# Patient Record
Sex: Female | Born: 1990 | Race: Black or African American | Hispanic: No | Marital: Single | Smoking: Current some day smoker
Health system: Southern US, Community
[De-identification: ages and names within clinical notes are randomized; demographics above are authoritative.]

## PROBLEM LIST (undated history)

## (undated) ENCOUNTER — Inpatient Hospital Stay (HOSPITAL_COMMUNITY): Payer: Self-pay

## (undated) ENCOUNTER — Emergency Department (HOSPITAL_COMMUNITY): Admission: EM

## (undated) DIAGNOSIS — E282 Polycystic ovarian syndrome: Secondary | ICD-10-CM

## (undated) DIAGNOSIS — D219 Benign neoplasm of connective and other soft tissue, unspecified: Secondary | ICD-10-CM

## (undated) HISTORY — PX: WISDOM TOOTH EXTRACTION: SHX21

---
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
## 2009-03-27 ENCOUNTER — Inpatient Hospital Stay (HOSPITAL_COMMUNITY): Admission: AD | Admit: 2009-03-27 | Discharge: 2009-03-28 | Payer: Self-pay | Admitting: Obstetrics & Gynecology

## 2010-02-12 ENCOUNTER — Inpatient Hospital Stay (HOSPITAL_COMMUNITY): Admission: AD | Admit: 2010-02-12 | Discharge: 2010-02-12 | Payer: Self-pay | Admitting: Obstetrics & Gynecology

## 2010-05-25 ENCOUNTER — Ambulatory Visit: Payer: Self-pay | Admitting: Nurse Practitioner

## 2010-05-25 ENCOUNTER — Inpatient Hospital Stay (HOSPITAL_COMMUNITY): Admission: AD | Admit: 2010-05-25 | Discharge: 2010-05-25 | Payer: Self-pay | Admitting: Obstetrics & Gynecology

## 2010-05-30 ENCOUNTER — Ambulatory Visit: Payer: Self-pay | Admitting: Nurse Practitioner

## 2010-05-30 ENCOUNTER — Inpatient Hospital Stay (HOSPITAL_COMMUNITY): Admission: AD | Admit: 2010-05-30 | Discharge: 2010-05-30 | Payer: Self-pay | Admitting: Obstetrics & Gynecology

## 2010-07-01 ENCOUNTER — Inpatient Hospital Stay (HOSPITAL_COMMUNITY): Admission: AD | Admit: 2010-07-01 | Discharge: 2010-07-01 | Payer: Self-pay | Admitting: Family Medicine

## 2010-07-18 ENCOUNTER — Ambulatory Visit (HOSPITAL_COMMUNITY): Admission: RE | Admit: 2010-07-18 | Discharge: 2010-07-18 | Payer: Self-pay | Admitting: Family Medicine

## 2010-07-25 ENCOUNTER — Ambulatory Visit: Payer: Self-pay | Admitting: Obstetrics and Gynecology

## 2010-07-30 ENCOUNTER — Ambulatory Visit (HOSPITAL_COMMUNITY): Admission: RE | Admit: 2010-07-30 | Discharge: 2010-07-30 | Payer: Self-pay | Admitting: Obstetrics & Gynecology

## 2010-08-07 ENCOUNTER — Ambulatory Visit: Payer: Self-pay | Admitting: Physician Assistant

## 2010-08-07 ENCOUNTER — Inpatient Hospital Stay (HOSPITAL_COMMUNITY): Admission: AD | Admit: 2010-08-07 | Discharge: 2010-08-07 | Payer: Self-pay | Admitting: Obstetrics & Gynecology

## 2010-08-22 ENCOUNTER — Ambulatory Visit: Payer: Self-pay | Admitting: Advanced Practice Midwife

## 2010-08-22 ENCOUNTER — Inpatient Hospital Stay (HOSPITAL_COMMUNITY): Admission: AD | Admit: 2010-08-22 | Discharge: 2010-08-23 | Payer: Self-pay | Admitting: Obstetrics & Gynecology

## 2010-09-12 ENCOUNTER — Ambulatory Visit: Payer: Self-pay | Admitting: Obstetrics & Gynecology

## 2010-09-12 ENCOUNTER — Encounter (INDEPENDENT_AMBULATORY_CARE_PROVIDER_SITE_OTHER): Payer: Self-pay | Admitting: *Deleted

## 2010-09-12 LAB — CONVERTED CEMR LAB
MCV: 90.9 fL (ref 78.0–100.0)
Platelets: 226 10*3/uL (ref 150–400)
RDW: 13 % (ref 11.5–15.5)

## 2010-10-10 ENCOUNTER — Ambulatory Visit: Payer: Self-pay | Admitting: Obstetrics & Gynecology

## 2010-10-17 ENCOUNTER — Ambulatory Visit (HOSPITAL_COMMUNITY): Admission: RE | Admit: 2010-10-17 | Discharge: 2010-10-17 | Payer: Self-pay | Admitting: Family Medicine

## 2010-11-07 ENCOUNTER — Ambulatory Visit: Payer: Self-pay | Admitting: Obstetrics & Gynecology

## 2010-11-09 ENCOUNTER — Emergency Department (HOSPITAL_COMMUNITY)
Admission: EM | Admit: 2010-11-09 | Discharge: 2010-11-09 | Payer: Self-pay | Source: Home / Self Care | Admitting: Emergency Medicine

## 2010-11-14 ENCOUNTER — Ambulatory Visit: Payer: Self-pay | Admitting: Obstetrics & Gynecology

## 2010-11-21 ENCOUNTER — Ambulatory Visit: Payer: Self-pay | Admitting: Family Medicine

## 2010-11-22 ENCOUNTER — Observation Stay (HOSPITAL_COMMUNITY)
Admission: RE | Admit: 2010-11-22 | Discharge: 2010-11-22 | Payer: Self-pay | Source: Home / Self Care | Attending: Obstetrics and Gynecology | Admitting: Obstetrics and Gynecology

## 2010-11-28 ENCOUNTER — Encounter (INDEPENDENT_AMBULATORY_CARE_PROVIDER_SITE_OTHER): Payer: Self-pay | Admitting: *Deleted

## 2010-11-28 ENCOUNTER — Ambulatory Visit: Payer: Self-pay | Admitting: Obstetrics & Gynecology

## 2010-11-28 LAB — CONVERTED CEMR LAB
Chlamydia, Swab/Urine, PCR: NEGATIVE
GC Probe Amp, Urine: NEGATIVE

## 2010-12-02 ENCOUNTER — Inpatient Hospital Stay (HOSPITAL_COMMUNITY)
Admission: RE | Admit: 2010-12-02 | Discharge: 2010-12-04 | Payer: Self-pay | Source: Home / Self Care | Attending: Obstetrics & Gynecology | Admitting: Obstetrics & Gynecology

## 2010-12-15 ENCOUNTER — Inpatient Hospital Stay (HOSPITAL_COMMUNITY)
Admission: AD | Admit: 2010-12-15 | Discharge: 2010-12-15 | Payer: Self-pay | Source: Home / Self Care | Attending: Family Medicine | Admitting: Family Medicine

## 2010-12-21 ENCOUNTER — Encounter: Payer: Self-pay | Admitting: Obstetrics & Gynecology

## 2010-12-22 ENCOUNTER — Encounter: Payer: Self-pay | Admitting: *Deleted

## 2010-12-22 ENCOUNTER — Encounter: Payer: Self-pay | Admitting: Gastroenterology

## 2010-12-22 ENCOUNTER — Encounter: Payer: Self-pay | Admitting: Obstetrics & Gynecology

## 2010-12-26 NOTE — Discharge Summary (Addendum)
  NAMEYAMILEE, Michele Boyd              ACCOUNT NO.:  [id number]  MEDICAL RECORD NO.:  [id number]          PATIENT TYPE:  INP  LOCATION:  9111                          [facility]:  [facility]  PHYSICIAN:  Allie Bossier, MD        DATE OF BIRTH:  [birth year]  DATE OF ADMISSION:  12/02/2010 DATE OF DISCHARGE:  12/04/2010                              DISCHARGE SUMMARY   ADMISSION DIAGNOSES: 1. Intrauterine pregnancy at 13 and 1 weeks. 2. Persistent breech presentation status failed external cephalic     version attempt.  DISCHARGE DIAGNOSIS:   1. Status post primary low transverse cesarean section via Pfannenstiel skin incision on December 02, 2010 for persistent breech presentation. 2. Uterine Fibroid  ATTENDING: Allie Bossier, MD   FELLOW: Maryelizabeth Kaufmann, MD.  PERTINENT FINDINGS:  Delivery of a viable female in breech presentation, Apgars were 8 and 9, weight was 6 pounds and 4 ounces, clear amniotic fluid.  Intact placenta with 3-vessel cord, approximately 10-cm fibroid on top of the uterus, subserosal and myometrial.  Otherwise, normal ovaries bilaterally and tubes.  No complications immediately.  HOSPITAL COURSE:  This is a 20 year old gravid 1 who presented at 46 and 1 weeks with persistent breech presentation and underwent a primary cesarean section for persistent breech presentation status post failed ECV attempt.  The patient's postoperative course was subsequently benign.  The patient was otherwise hemodynamically stable.  Her postoperative hemoglobin was 7.7 and 32.5.  She was otherwise ambulatory and doing well without any acute complaints at the time of discharge. Patient is  to take iron for postpartum anemia. She was discharged in stable condition on postoperative day #2.  DISCHARGE MEDICATIONS: 1. Ibuprofen 600 mg 1 tab p.o. q.6 hours p.r.n. 2. Percocet 5/325 1 tab p.o. q.4 hours p.r.n. 3. Colace 100 mg 1 tab p.o. b.i.d. 4. Depo-Provera prior to discharge for postpartum  contraception. 5. Ferrous sulfate over the counter twice daily for anemia  FOLLOWUP:  The patient is to follow up in the Health Department in 6 weeks for postpartum check.  ER WARNINGS:  The patient is to return to the emergency department with any fevers, chills, nausea, vomiting, any incisional problems, redness, swelling, discharge, any type of opening or dehiscence.    ______________________________ Maryelizabeth Kaufmann, MD   ______________________________ Allie Bossier, MD    LC/MEDQ  D:  12/04/2010  T:  12/05/2010  Job:  [id number]  Electronically Signed by Maryelizabeth Kaufmann MD on 12/09/2010 11:20:40 AM Electronically Signed by Nicholaus Bloom MD on 12/26/2010 03:48:13 PM

## 2010-12-30 ENCOUNTER — Ambulatory Visit: Admit: 2010-12-30 | Payer: Self-pay | Admitting: Obstetrics & Gynecology

## 2011-02-05 ENCOUNTER — Other Ambulatory Visit: Payer: Self-pay | Admitting: Family Medicine

## 2011-02-05 ENCOUNTER — Ambulatory Visit (INDEPENDENT_AMBULATORY_CARE_PROVIDER_SITE_OTHER): Payer: Medicaid Other | Admitting: Family Medicine

## 2011-02-05 ENCOUNTER — Encounter (INDEPENDENT_AMBULATORY_CARE_PROVIDER_SITE_OTHER): Payer: Self-pay | Admitting: *Deleted

## 2011-02-05 DIAGNOSIS — D259 Leiomyoma of uterus, unspecified: Secondary | ICD-10-CM

## 2011-02-05 LAB — CONVERTED CEMR LAB
HCT: 42 % (ref 36.0–46.0)
MCHC: 33.6 g/dL (ref 30.0–36.0)
MCV: 88.6 fL (ref 78.0–100.0)
Platelets: 339 10*3/uL (ref 150–400)
RBC: 4.74 M/uL (ref 3.87–5.11)
TSH: 0.531 microintl units/mL (ref 0.350–4.500)
WBC: 5.4 10*3/uL (ref 4.0–10.5)

## 2011-02-10 LAB — POCT URINALYSIS DIPSTICK
Bilirubin Urine: NEGATIVE
Bilirubin Urine: NEGATIVE
Glucose, UA: NEGATIVE mg/dL
Glucose, UA: NEGATIVE mg/dL
Glucose, UA: NEGATIVE mg/dL
Hgb urine dipstick: NEGATIVE
Ketones, ur: NEGATIVE mg/dL
Ketones, ur: NEGATIVE mg/dL
Nitrite: NEGATIVE
Nitrite: NEGATIVE
Protein, ur: NEGATIVE mg/dL
Urobilinogen, UA: 0.2 mg/dL (ref 0.0–1.0)
Urobilinogen, UA: 0.2 mg/dL (ref 0.0–1.0)
Urobilinogen, UA: 0.2 mg/dL (ref 0.0–1.0)
pH: 7 (ref 5.0–8.0)
pH: 8 (ref 5.0–8.0)

## 2011-02-10 LAB — CBC
MCHC: 34.3 g/dL (ref 30.0–36.0)
MCV: 87.9 fL (ref 78.0–100.0)
RBC: 3.65 MIL/uL — ABNORMAL LOW (ref 3.87–5.11)
RDW: 13.6 % (ref 11.5–15.5)
RDW: 13.6 % (ref 11.5–15.5)
WBC: 8.6 10*3/uL (ref 4.0–10.5)

## 2011-02-10 LAB — TYPE AND SCREEN
ABO/RH(D): O POS
Antibody Screen: NEGATIVE

## 2011-02-11 LAB — POCT URINALYSIS DIPSTICK
Bilirubin Urine: NEGATIVE
Bilirubin Urine: NEGATIVE
Glucose, UA: NEGATIVE mg/dL
Hgb urine dipstick: NEGATIVE
Hgb urine dipstick: NEGATIVE
Ketones, ur: NEGATIVE mg/dL
Nitrite: NEGATIVE
Protein, ur: NEGATIVE mg/dL
Specific Gravity, Urine: 1.02 (ref 1.005–1.030)

## 2011-02-13 LAB — URINALYSIS, ROUTINE W REFLEX MICROSCOPIC
Bilirubin Urine: NEGATIVE
Bilirubin Urine: NEGATIVE
Glucose, UA: NEGATIVE mg/dL
Hgb urine dipstick: NEGATIVE
Hgb urine dipstick: NEGATIVE
Ketones, ur: NEGATIVE mg/dL
Nitrite: NEGATIVE
Nitrite: NEGATIVE
Protein, ur: NEGATIVE mg/dL
Specific Gravity, Urine: 1.02 (ref 1.005–1.030)
pH: 6 (ref 5.0–8.0)

## 2011-02-13 LAB — POCT URINALYSIS DIPSTICK
Ketones, ur: NEGATIVE mg/dL
Specific Gravity, Urine: 1.02 (ref 1.005–1.030)

## 2011-02-13 LAB — URINE CULTURE: Colony Count: >=100000

## 2011-02-14 LAB — WET PREP, GENITAL
Clue Cells Wet Prep HPF POC: NONE SEEN
Trich, Wet Prep: NONE SEEN

## 2011-02-14 LAB — URINALYSIS, ROUTINE W REFLEX MICROSCOPIC
Bilirubin Urine: NEGATIVE
Glucose, UA: NEGATIVE mg/dL
Hgb urine dipstick: NEGATIVE
Protein, ur: NEGATIVE mg/dL
Specific Gravity, Urine: >1.03 — ABNORMAL HIGH (ref 1.005–1.030)

## 2011-02-14 LAB — POCT URINALYSIS DIPSTICK
Bilirubin Urine: NEGATIVE
Hgb urine dipstick: NEGATIVE
Nitrite: NEGATIVE
Protein, ur: NEGATIVE mg/dL
Specific Gravity, Urine: 1.02 (ref 1.005–1.030)
pH: 7 (ref 5.0–8.0)

## 2011-02-14 LAB — URINE CULTURE

## 2011-02-14 LAB — GC/CHLAMYDIA PROBE AMP, GENITAL
Chlamydia, DNA Probe: NEGATIVE
GC Probe Amp, Genital: NEGATIVE

## 2011-02-16 LAB — URINALYSIS, ROUTINE W REFLEX MICROSCOPIC
Hgb urine dipstick: NEGATIVE
Urobilinogen, UA: 0.2 mg/dL (ref 0.0–1.0)

## 2011-02-16 LAB — URINE CULTURE: Colony Count: >=100000

## 2011-02-16 LAB — GC/CHLAMYDIA PROBE AMP, GENITAL: GC Probe Amp, Genital: NEGATIVE

## 2011-02-17 ENCOUNTER — Ambulatory Visit (HOSPITAL_COMMUNITY): Payer: Medicaid Other | Attending: Family Medicine

## 2011-02-21 NOTE — Progress Notes (Signed)
NAME:  Michele Boyd, Michele Boyd              ACCOUNT NO.:  [id number]  MEDICAL RECORD NO.:  [id number]           PATIENT TYPE:  A  LOCATION:  [facility]                   FACILITY:  [facility]  PHYSICIAN:  Lucina Mellow, DO   DATE OF BIRTH:  11-15-1991  DATE OF SERVICE:  02/05/2011                                 CLINIC NOTE  The patient is a 20 year old gravida 1, now para 1 who presents to the [facility] for her postpartum exam.  HISTORY OF PRESENT ILLNESS:  The patient had a C-section on December 02, 2010, after persistent breech presentation after failed version.  The patient was admitted on December 02, 2010, and was discharged home on December 04, 2010.  Separate operative and discharge summaries are dictated elsewhere and it included in part of the patient's clinic chart.  The patient had a routine C-section, delivered a viable infant female, Apgars 8 and 9, weight of 6 pounds 4 ounces. There was noted prior to delivery of a 10-cm fibroid this again was seen and noted subserosal and myometrial during the time of the C-section and it was left in place.  The patient states that she has had some form of bleeding every day since she has had her C-section and most of time it is bright red bleeding.  On occasion, she will have clots, occasionally it will get darker but it does not stay dark for more than a few hours. She reports that she has had heavy bleeding throughout the bigger portion today and declines having a pelvic exam to evaluate.  She has returned sexual activity.  She states it was not painful and was not uncomfortable.  She is not breast-feeding and has not breastfed at all. She received a Depo-Provera shot prior to discharge from hospital and wishes to continue this for her use of birth control.  She has no other complaints besides the continued bleeding concerns of the uterine fibroids.  PAST MEDICAL HISTORY:  Benign.  PAST SURGICAL HISTORY:  C-section at 3 and 1  weeks'.  MEDICATIONS:  None.  ALLERGIES:  No known allergies.  PHYSICAL EXAMINATION:  VITAL SIGNS:  On exam today, the patient is 196.8 pounds, height is 62 inches, temperature 99, pulse 93, blood pressure 113/79. GENERAL:  She is a pleasant African American female who looks her stated age at 20 years old. HEART:  Regular rate and rhythm with no audible murmurs. LUNGS:  Clear to auscultation bilaterally.  Thyroid not palpable. ABDOMEN:  Positive bowel sounds, soft, and benign.  Lower transverse incision site noted to be well healed and nontender on palpation as noted GYN and breast exams are deferred per patient request today.  ASSESSMENT: 1. Postpartum exam.  The patient is doing well postpartum but     continues to have vaginal bleeding.  We will obtain a CBC to     evaluate for anemia and a TSH because her continue bleeding. 2. Uterine fibroid.  We will obtain pelvic ultrasound to determine the     size of the uterine fibroid and to determine if surgical     intervention is necessary at this time.  It may also  be a source     for bleeding.  She may warrant further evaluation by one of our GYN     surgeons to discuss treatment options. 3. Birth control.  The patient understands that she has until April 2     to get her second Depo shot and she can get that either here at our     clinic or she can go to the [facility] downtown [facility].  The patient is to return to our clinic     after her ultrasound and blood work done, so that we can discuss     results and further treatment plans.  She voices understanding and     agrees with this plan.  Finally, her [place] postpartum     depression scale is 2-3 which is negative screen, and she does not     need to be screened further at this time.          ______________________________ Lucina Mellow, DO    SH/MEDQ  D:  02/05/2011  T:  02/06/2011  Job:  [id number]

## 2011-02-23 LAB — URINALYSIS, ROUTINE W REFLEX MICROSCOPIC
Glucose, UA: NEGATIVE mg/dL
Specific Gravity, Urine: 1.025 (ref 1.005–1.030)
pH: 5.5 (ref 5.0–8.0)

## 2011-02-23 LAB — URINE MICROSCOPIC-ADD ON

## 2011-03-12 LAB — WET PREP, GENITAL: Yeast Wet Prep HPF POC: NONE SEEN

## 2011-03-12 LAB — URINALYSIS, ROUTINE W REFLEX MICROSCOPIC
Glucose, UA: NEGATIVE mg/dL
Protein, ur: NEGATIVE mg/dL
pH: 6 (ref 5.0–8.0)

## 2011-03-14 ENCOUNTER — Ambulatory Visit: Payer: Medicaid Other | Admitting: Obstetrics & Gynecology

## 2011-08-28 LAB — URINALYSIS, ROUTINE W REFLEX MICROSCOPIC
Bilirubin Urine: NEGATIVE
Nitrite: NEGATIVE
Specific Gravity, Urine: 1.025
pH: 6.5

## 2011-08-28 LAB — POCT PREGNANCY, URINE: Operator id: 234331

## 2011-12-02 NOTE — L&D Delivery Note (Signed)
Delivery Note At 12:27 AM a viable and healthy female was delivered via  (Presentation: OA ).  APGAR: 9/9, ; weight .   Placenta status: Spontaneously and grossly intact with succenturate lobe noted. Cord:  with the following complications:   Very short cord.  Cord pH: 7.40 Amniotic fluid very foul smelling  No difficulty with shoulders  Anesthesia: Epidural  Episiotomy:  Lacerations:  Suture Repair: none Est. Blood Loss (mL):   Mom to postpartum.  Baby to nursery-stable .  [facility] 04/27/2012, 12:44 AM

## 2012-02-12 ENCOUNTER — Encounter (HOSPITAL_COMMUNITY): Payer: Self-pay | Admitting: *Deleted

## 2012-02-12 ENCOUNTER — Inpatient Hospital Stay (HOSPITAL_COMMUNITY)
Admission: AD | Admit: 2012-02-12 | Discharge: 2012-02-13 | Disposition: A | Discharge status: Home / Self Care | Payer: Medicaid Other | Source: Ambulatory Visit | Attending: Obstetrics & Gynecology | Admitting: Obstetrics & Gynecology

## 2012-02-12 ENCOUNTER — Inpatient Hospital Stay (HOSPITAL_COMMUNITY): Payer: Medicaid Other

## 2012-02-12 DIAGNOSIS — O99891 Other specified diseases and conditions complicating pregnancy: Secondary | ICD-10-CM | POA: Insufficient documentation

## 2012-02-12 DIAGNOSIS — D259 Leiomyoma of uterus, unspecified: Secondary | ICD-10-CM | POA: Insufficient documentation

## 2012-02-12 DIAGNOSIS — R109 Unspecified abdominal pain: Secondary | ICD-10-CM | POA: Insufficient documentation

## 2012-02-12 DIAGNOSIS — O3410 Maternal care for benign tumor of corpus uteri, unspecified trimester: Secondary | ICD-10-CM | POA: Insufficient documentation

## 2012-02-12 HISTORY — DX: Benign neoplasm of connective and other soft tissue, unspecified: D21.9

## 2012-02-12 LAB — CBC
MCH: 31.3 pg (ref 26.0–34.0)
MCHC: 33.1 g/dL (ref 30.0–36.0)
Platelets: 221 10*3/uL (ref 150–400)
RDW: 13.3 % (ref 11.5–15.5)

## 2012-02-12 LAB — POCT PREGNANCY, URINE: Preg Test, Ur: POSITIVE — AB

## 2012-02-12 LAB — URINALYSIS, ROUTINE W REFLEX MICROSCOPIC
Hgb urine dipstick: NEGATIVE
Leukocytes, UA: NEGATIVE
Nitrite: NEGATIVE
Specific Gravity, Urine: >1.03 — ABNORMAL HIGH (ref 1.005–1.030)
Urobilinogen, UA: 0.2 mg/dL (ref 0.0–1.0)

## 2012-02-12 LAB — HCG, QUANTITATIVE, PREGNANCY: hCG, Beta Chain, Quant, S: 8112 m[IU]/mL — ABNORMAL HIGH (ref <5)

## 2012-02-12 NOTE — MAU Note (Signed)
PT SAYS HER LAST NL CYCLE WAS IN OCT.  NOV- SPOTTING.   DEC- SPOTTING.  JAN- SPOTTING.  FEB- MARCH- NOTHING.  CRAMPING STARTED LAST WEEK

## 2012-02-12 NOTE — MAU Note (Signed)
Pt reports history of PCOS and irregular periods. Also reports a history of firoids. States she has had lower abd pain x 1 week, worsening today. LMP 12/16/2011. States preg test at home was negative.

## 2012-02-13 DIAGNOSIS — D259 Leiomyoma of uterus, unspecified: Secondary | ICD-10-CM | POA: Insufficient documentation

## 2012-02-13 DIAGNOSIS — O3410 Maternal care for benign tumor of corpus uteri, unspecified trimester: Secondary | ICD-10-CM

## 2012-02-13 NOTE — Discharge Instructions (Signed)
You can take Tylenol as needed for pain. Please avoid medications such as Ibuprofen (ie-Motrin, Advil), Aleve and Aspirin. Please make an appointment with the health department as soon as possible.  Take care!    ________________________________________     To schedule your Maternity Eligibility Appointment, please call [phone].  When you arrive for your appointment you must bring the following items or information listed below.  Your appointment will be rescheduled if you do not have these items or are 15 minutes late. If currently receiving Medicaid, you MUST bring: 1. Medicaid Card 2. Social Security Card 3. Picture ID 4. Proof of Pregnancy 5. Verification of current address if the address on Medicaid card is incorrect "postmarked mail" If not receiving Medicaid, you MUST bring: 1. Social Security Card 2. Picture ID 3. Birth Certificate (if available) Passport or *Green Card 4. Proof of Pregnancy 5. Verification of current address "postmarked mail" for each income presented. 6. Verification of insurance coverage, if any 7. Check stubs from each employer for the previous month (if unable to present check stub  for each week, we will accept check stub for the first and last week ill the same month.) If you can't locate check stubs, you must bring a letter from the employer(s) and it must have the following information on letterhead, typed, in English: o name of company o company telephone number o how long been with the company, if less than one month o how much person earns per hour o how many hours per week work o the gross pay the person earned for the previous month If you are 22 years old or less, you do not have to bring proof of income unless you work or live with the father of the baby and at that time we will need proof of income from you and/or the father of the baby. Green Card recipients are eligible for Medicaid for Pregnant Women (MPW)   Uterine Fibroid A  fibroid is a noncancerous tumor made of smooth muscle. It is often found in the uterus, but can be found in other parts of the body. HOME CARE  Take your medicine as told by your doctor.   Keep all follow-up visits as told by your doctor.   Only take medicine as told by your doctor. Do not take aspirin. It can cause bleeding.   If you have heavy periods, lie down with your feet raised slightly above your heart. Place cold packs on your lower belly (abdomen).   Write down the number of pads you use and how often you change them. Tell your doctor.   Take iron pills and eat lots of green vegetables.  GET HELP RIGHT AWAY IF:   You have heavier bleeding than normal.   You pass out (faint).   You change your pad every 20 to 30 minutes.   You start to have belly (abdominal) pain.   You develop a fever.  MAKE SURE YOU:  Understand these instructions.   Will watch your condition.   Will get help right away if you are not doing well or get worse.  Document Released: 02/13/2009 Document Revised: 11/06/2011 Document Reviewed: 02/13/2009 [facility] Patient Information 2012 [place], [place].

## 2012-02-13 NOTE — MAU Provider Note (Signed)
Attestation of Attending Supervision of Resident: Evaluation and management procedures were performed by the [facility] Medicine Resident under my supervision.  I have reviewed the resident's note and chart, and I agree with management and plan.  Jaynie Collins, M.D. 02/13/2012 12:52 AM

## 2012-02-13 NOTE — MAU Provider Note (Signed)
History    Chief Complaint  Patient presents with  . Abdominal Pain   HPI Patient is a 21 yo G2P1 presenting for abdominal pain. Patient states she has a known history of uterine fibroid. She also states she is pregnant at unknown weeks. (LMP in October with some spotting since then) She does not take medication for her fibroids. Pain started a few days ago and is getting worse. She works at [employer] and standing for long periods of time make the pain worse. She has not established prenatal care.  OB History    Grav Para Term Preterm Abortions TAB SAB Ect Mult Living   2         1      Past Medical History  Diagnosis Date  . Fibroid     Past Surgical History  Procedure Date  . Cesarean section   . Wisdom tooth extraction     No family history on file.  History  Substance Use Topics  . Smoking status: Former Games developer  . Smokeless tobacco: Not on file  . Alcohol Use: No    Allergies: No Known Allergies  No prescriptions prior to admission    Review of Systems  Constitutional: Negative for fever and chills.  Respiratory: Negative for shortness of breath.   Cardiovascular: Negative for chest pain.  Gastrointestinal: Positive for abdominal pain. Negative for nausea and vomiting.  Genitourinary: Negative for dysuria.  Skin: Negative for rash.  Neurological: Negative for headaches.   Physical Exam   Blood pressure 121/78, pulse 73, temperature 99.5 F (37.5 C), resp. rate 18, height 5\' 2"  (1.575 m), weight 87.998 kg (194 lb), last menstrual period 12/16/2011, SpO2 100.00%.  Physical Exam  Constitutional: She is oriented to person, place, and time. She appears well-developed and well-nourished. No distress.  HENT:  Head: Normocephalic and atraumatic.  Neck: Normal range of motion.  Cardiovascular: Normal rate and regular rhythm.   No murmur heard. Respiratory: Effort normal and breath sounds normal. She has no wheezes.  GI: Soft.       Gravid.  Musculoskeletal:  Normal range of motion. She exhibits no edema and no tenderness.  Neurological: She is alert and oriented to person, place, and time. No cranial nerve deficit.  Skin: Skin is dry. No rash noted.    MAU Course  Procedures Results for orders placed during the hospital encounter of 02/12/12 (from the past 24 hour(s))  URINALYSIS, ROUTINE W REFLEX MICROSCOPIC     Status: Abnormal   Collection Time   02/12/12  7:50 PM      Component Value Range   Color, Urine YELLOW  YELLOW    APPearance CLEAR  CLEAR    Specific Gravity, Urine >1.030 (*) 1.005 - 1.030    pH 6.0  5.0 - 8.0    Glucose, UA NEGATIVE  NEGATIVE (mg/dL)   Hgb urine dipstick NEGATIVE  NEGATIVE    Bilirubin Urine NEGATIVE  NEGATIVE    Ketones, ur 15 (*) NEGATIVE (mg/dL)   Protein, ur NEGATIVE  NEGATIVE (mg/dL)   Urobilinogen, UA 0.2  0.0 - 1.0 (mg/dL)   Nitrite NEGATIVE  NEGATIVE    Leukocytes, UA NEGATIVE  NEGATIVE   POCT PREGNANCY, URINE     Status: Abnormal   Collection Time   02/12/12  8:07 PM      Component Value Range   Preg Test, Ur POSITIVE (*) NEGATIVE   HCG, QUANTITATIVE, PREGNANCY     Status: Abnormal   Collection Time   02/12/12  9:00 PM      Component Value Range   hCG, Beta Chain, Quant, S 8112 (*) <5 (mIU/mL)  CBC     Status: Abnormal   Collection Time   02/12/12  9:00 PM      Component Value Range   WBC 7.3  4.0 - 10.5 (K/uL)   RBC 3.84 (*) 3.87 - 5.11 (MIL/uL)   Hemoglobin 12.0  12.0 - 15.0 (g/dL)   HCT 62.9  52.8 - 41.3 (%)   MCV 94.3  78.0 - 100.0 (fL)   MCH 31.3  26.0 - 34.0 (pg)   MCHC 33.1  30.0 - 36.0 (g/dL)   RDW 24.4  01.0 - 27.2 (%)   Platelets 221  150 - 400 (K/uL)   MDM Patient found to be [redacted]w[redacted]d by ultrasound today. She was observed on the monitor for 20 minutes.   Assessment and Plan  21 yo G2P0 at [redacted]w[redacted]d by ultrasound presenting with abdominal pain. - Pain most likely secondary to fibroid. Patient agrees. Will encourage her to take Tylenol as needed for pain. - Most prenatal labs done  today. Patient states she is aware of the medicaid eligibility process at the Health Dept and she will start this process soon. - Patient excused from work tomorrow, 02/13/12. - Plan discussed with Maylon Cos, CNM who agrees with above plan  Ylonda Storr 02/13/2012, 12:33 AM

## 2012-02-17 ENCOUNTER — Encounter: Payer: Self-pay | Admitting: Obstetrics & Gynecology

## 2012-02-17 DIAGNOSIS — Z3490 Encounter for supervision of normal pregnancy, unspecified, unspecified trimester: Secondary | ICD-10-CM | POA: Insufficient documentation

## 2012-03-26 ENCOUNTER — Encounter (HOSPITAL_COMMUNITY): Payer: Self-pay | Admitting: *Deleted

## 2012-03-26 ENCOUNTER — Inpatient Hospital Stay (HOSPITAL_COMMUNITY)
Admission: AD | Admit: 2012-03-26 | Discharge: 2012-03-26 | Disposition: A | Discharge status: Home / Self Care | Payer: Medicaid Other | Source: Ambulatory Visit | Attending: Obstetrics & Gynecology | Admitting: Obstetrics & Gynecology

## 2012-03-26 DIAGNOSIS — Z3490 Encounter for supervision of normal pregnancy, unspecified, unspecified trimester: Secondary | ICD-10-CM

## 2012-03-26 DIAGNOSIS — B3731 Acute candidiasis of vulva and vagina: Secondary | ICD-10-CM | POA: Insufficient documentation

## 2012-03-26 DIAGNOSIS — N760 Acute vaginitis: Secondary | ICD-10-CM | POA: Diagnosis present

## 2012-03-26 DIAGNOSIS — D259 Leiomyoma of uterus, unspecified: Secondary | ICD-10-CM

## 2012-03-26 DIAGNOSIS — B373 Candidiasis of vulva and vagina: Secondary | ICD-10-CM | POA: Insufficient documentation

## 2012-03-26 DIAGNOSIS — N949 Unspecified condition associated with female genital organs and menstrual cycle: Secondary | ICD-10-CM | POA: Insufficient documentation

## 2012-03-26 DIAGNOSIS — N898 Other specified noninflammatory disorders of vagina: Secondary | ICD-10-CM

## 2012-03-26 LAB — WET PREP, GENITAL

## 2012-03-26 MED ORDER — FLUCONAZOLE 150 MG PO TABS: 150.0000 mg | ORAL_TABLET | Freq: Every day | ORAL | Status: AC

## 2012-03-26 MED ORDER — MICONAZOLE NITRATE 100 MG VA SUPP: 100.0000 mg | Freq: Every day | VAGINAL | Status: AC

## 2012-03-26 NOTE — MAU Note (Signed)
Patient states she has had no prenatal care waiting Medicaid. States she has been having a white thick vaginal discharge with no odor since yesterday. Reports no contractions, leaking or bleeding and has good fetal movement.

## 2012-03-26 NOTE — MAU Note (Signed)
Pt in c/o thick white vaginal discharge with irritation that started yesterday.  Denies any bleeding or lof. + FM.  Denies any pain.

## 2012-03-26 NOTE — MAU Note (Signed)
No PNC, waiting on medicaid

## 2012-03-26 NOTE — MAU Provider Note (Signed)
  History    CSN: [id number]  Arrival date and time: 03/26/12 1227   First Provider Initiated Contact with Patient 03/26/12 1507     Chief Complaint  Patient presents with  . Vaginal Discharge  itching and burning of the vagina  HPI Patient is a 21 y/o aaf with no prenatal care at 34.2 by LMP here with thick white vaginal discharge and vaginal itching/burning/tenderness. No recent antibiotic history.  Denies urinary symptoms. Denies UTI in the past. Denies GI symptoms Denies STD history or chance that she could have an STD. Last sexual activity was two days ago with partner.  No vaginal bleeding, no contractions. Baby is moving.  Past Medical History  Diagnosis Date  . Fibroid    Past Surgical History  Procedure Date  . Cesarean section   . Wisdom tooth extraction    Family History  Problem Relation Age of Onset  . Anesthesia problems Neg Hx    History  Substance Use Topics  . Smoking status: Former Games developer  . Smokeless tobacco: Not on file  . Alcohol Use: No   Allergies: No Known Allergies  Prescriptions prior to admission  Medication Sig Dispense Refill  . Pediatric Multiple Vit-C-FA (FLINSTONES GUMMIES OMEGA-3 DHA) CHEW Chew 1 tablet by mouth daily.       Tobacco use: Patient is a non-smoker.   ROS Pertinent items are noted in HPI.  Physical Exam   Blood pressure 121/81, pulse 90, temperature 97.4 F (36.3 C), temperature source Oral, resp. rate 16, height 5\' 2"  (1.575 m), weight 90.266 kg (199 lb), last menstrual period 12/16/2011, SpO2 100.00%, unknown if currently breastfeeding.  Physical Exam General:  Lungs:  Normal respiratory effort, chest expands symmetrically. Lungs are clear to auscultation, no crackles or wheezes. Heart - Regular rate and rhythm.  No murmurs, gallops or rubs.    Abdomen: soft and non-tender without masses, organomegaly or hernias noted.  No guarding or rebound Extremities:   Non-tender, No cyanosis, edema, or deformity  noted. Skin:  Intact without suspicious lesions or rashes Vagina: swollen labia major, tender to light touch, friable tissue that bleeds easily, white thick curd like discharge. No bleeding.  Cervix: Not well visualized on speculum exam.   MAU Course  Procedures  MDM - Speculum exam performed with GBS, Wet Prep, Gc/CL - clinically appears like a classic candidal infection of the vagina  Assessment and Plan  21 y/o aaf with no prenatal care at 34.2 by LMP here with thick white vaginal discharge and vaginal itching/burning/tenderness.  1. Candidal Infection of the Vagina - wet prep to confirm - gc/cl - although denies possibility - GBS done because she does not have good follow up and she is almost 36 weeks.  Discussed with Dr. Magnus Sinning MD 03/26/2012, 3:16 PM   Patient seen and examined.  Agree with above note.  Candelaria Celeste JEHIEL 03/26/2012 3:50 PM

## 2012-03-27 LAB — GC/CHLAMYDIA PROBE AMP, GENITAL
Chlamydia, DNA Probe: NEGATIVE
GC Probe Amp, Genital: NEGATIVE

## 2012-03-29 ENCOUNTER — Encounter: Payer: Self-pay | Admitting: *Deleted

## 2012-03-29 LAB — CULTURE, BETA STREP (GROUP B ONLY)

## 2012-04-07 ENCOUNTER — Encounter: Payer: Self-pay | Admitting: Obstetrics and Gynecology

## 2012-04-15 ENCOUNTER — Ambulatory Visit (INDEPENDENT_AMBULATORY_CARE_PROVIDER_SITE_OTHER): Payer: Self-pay | Admitting: Obstetrics and Gynecology

## 2012-04-15 ENCOUNTER — Encounter: Payer: Self-pay | Admitting: Obstetrics & Gynecology

## 2012-04-15 ENCOUNTER — Encounter: Payer: Self-pay | Admitting: Advanced Practice Midwife

## 2012-04-15 VITALS — BP 100/65 | Temp 98.4°F | Wt 202.0 lb

## 2012-04-15 DIAGNOSIS — Z124 Encounter for screening for malignant neoplasm of cervix: Secondary | ICD-10-CM

## 2012-04-15 DIAGNOSIS — O0930 Supervision of pregnancy with insufficient antenatal care, unspecified trimester: Secondary | ICD-10-CM

## 2012-04-15 DIAGNOSIS — Z3490 Encounter for supervision of normal pregnancy, unspecified, unspecified trimester: Secondary | ICD-10-CM

## 2012-04-15 DIAGNOSIS — O34219 Maternal care for unspecified type scar from previous cesarean delivery: Secondary | ICD-10-CM

## 2012-04-15 DIAGNOSIS — Z98891 History of uterine scar from previous surgery: Secondary | ICD-10-CM

## 2012-04-15 LAB — POCT URINALYSIS DIP (DEVICE)
Hgb urine dipstick: NEGATIVE
Protein, ur: NEGATIVE mg/dL
Specific Gravity, Urine: 1.02 (ref 1.005–1.030)
Urobilinogen, UA: 0.2 mg/dL (ref 0.0–1.0)
pH: 7 (ref 5.0–8.0)

## 2012-04-15 NOTE — Progress Notes (Signed)
Nutrition Note:  (1st visit consult) Pt seen for late prenatal care at [redacted]w[redacted]d.  Pt did not know she was pregnant. Pt reports history of very high gest weight gain.  Currently has gain of 27#- plots 8#> expected. Pt was obese prior to pregnancy. Pt reports good intake of 3-4 meals, no N/V and is lactose intolerant.   No concerns known.  Pt does receive WIC services and is undecided about breastfeeding. Pt agrees to avoid unnecessary snacks and will focus on healthier options for remainder of pregnancy. Cy Blamer, RD

## 2012-04-15 NOTE — Progress Notes (Signed)
Pulse 90. Edema trace in ankles, feet.

## 2012-04-15 NOTE — Progress Notes (Signed)
   Subjective:    Michele Boyd is a G2P1001 [redacted]w[redacted]d being seen today for her first obstetrical visit.  Her obstetrical history is significant for late to care, prev C/S and short interval between pregnnacies., large fiboid., bleeding 2nd trimester.  Patient does intend to breast feed. Pregnancy history fully reviewed.  Patient reports no complaints.  Filed Vitals:   04/15/12 0812  BP: 100/65  Temp: 98.4 F (36.9 C)  Weight: 202 lb (91.627 kg)    HISTORY: OB History    Grav Para Term Preterm Abortions TAB SAB Ect Mult Living   2 1 1       1      # Outc Date GA Lbr Len/2nd Wgt Sex Del Anes PTL Lv   1 TRM 1/12     LTCS  No Yes   2 CUR              Past Medical History  Diagnosis Date  . Fibroid    Past Surgical History  Procedure Date  . Cesarean section   . Wisdom tooth extraction    Family History  Problem Relation Age of Onset  . Anesthesia problems Neg Hx      Exam    Uterus:     Pelvic Exam:    Perineum: Normal Perineum   Vulva: normal   Vagina:  normal mucosa, normal discharge       Cervix: no lesions   Adnexa: not evaluated   Bony Pelvis: gynecoid  System: Breast:  normal appearance, no masses or tenderness   Skin: normal coloration and turgor, no rashes    Neurologic: oriented, normal, grossly non-focal   Extremities: normal strength, tone, and muscle mass   HEENT PERRLA   Mouth/Teeth mucous membranes moist, pharynx normal without lesions and dental hygiene good   Neck supple   Cardiovascular: regular rate and rhythm   Respiratory:  appears well, vitals normal, no respiratory distress, acyanotic, normal RR, ear and throat exam is normal, neck free of mass or lymphadenopathy, chest clear, no wheezing, crepitations, rhonchi, normal symmetric air entry   Abdomen: S>D, large fundal fibroid, NT   Urinary: urethral meatus normal      Assessment:    Pregnancy: G2P1001 Patient Active Problem List  Diagnoses  . Uterine fibroids affecting pregnancy,  antepartum  . Uterine fibroid  . Supervision of normal pregnancy  . Vaginosis  . Previous cesarean section  . Insufficient prenatal care        Plan:     Initial labs drawn. 1 hr glu and Pap sent, UDS Prenatal vitamins-> continue Problem list reviewed and updated.  Ultrasound result reviewed  Follow up in1 weeks. 50% of 40 min visit spent on counseling and coordination of care.  Encouraged breastfeeding, discussed Depo, TOLAC consent   Michele Boyd 04/15/2012

## 2012-04-15 NOTE — Patient Instructions (Signed)
Breastfeeding BENEFITS OF BREASTFEEDING For the baby  The first milk (colostrum) helps the baby's digestive system function better.   There are antibodies from the mother in the milk that help the baby fight off infections.   The baby has a lower incidence of asthma, allergies, and SIDS (sudden infant death syndrome).   The nutrients in breast milk are better than formulas for the baby and helps the baby's brain grow better.   Babies who breastfeed have less gas, colic, and constipation.  For the mother  Breastfeeding helps develop a very special bond between mother and baby.   It is more convenient, always available at the correct temperature and cheaper than formula feeding.   It burns calories in the mother and helps with losing weight that was gained during pregnancy.   It makes the uterus contract back down to normal size faster and slows bleeding following delivery.   Breastfeeding mothers have a lower risk of developing breast cancer.  NURSE FREQUENTLY  A healthy, full-term baby may breastfeed as often as every hour or space his or her feedings to every 3 hours.   How often to nurse will vary from baby to baby. Watch your baby for signs of hunger, not the clock.   Nurse as often as the baby requests, or when you feel the need to reduce the fullness of your breasts.   Awaken the baby if it has been 3 to 4 hours since the last feeding.   Frequent feeding will help the mother make more milk and will prevent problems like sore nipples and engorgement of the breasts.  BABY'S POSITION AT THE BREAST  Whether lying down or sitting, be sure that the baby's tummy is facing your tummy.   Support the breast with 4 fingers underneath the breast and the thumb above. Make sure your fingers are well away from the nipple and baby's mouth.   Stroke the baby's lips and cheek closest to the breast gently with your finger or nipple.   When the baby's mouth is open wide enough, place all  of your nipple and as much of the dark area around the nipple as possible into your baby's mouth.   Pull the baby in close so the tip of the nose and the baby's cheeks touch the breast during the feeding.  FEEDINGS  The length of each feeding varies from baby to baby and from feeding to feeding.   The baby must suck about 2 to 3 minutes for your milk to get to him or her. This is called a "let down." For this reason, allow the baby to feed on each breast as long as he or she wants. Your baby will end the feeding when he or she has received the right balance of nutrients.   To break the suction, put your finger into the corner of the baby's mouth and slide it between his or her gums before removing your breast from his or her mouth. This will help prevent sore nipples.  REDUCING BREAST ENGORGEMENT  In the first week after your baby is born, you may experience signs of breast engorgement. When breasts are engorged, they feel heavy, warm, full, and may be tender to the touch. You can reduce engorgement if you:   Nurse frequently, every 2 to 3 hours. Mothers who breastfeed early and often have fewer problems with engorgement.   Place light ice packs on your breasts between feedings. This reduces swelling. Wrap the ice packs in a   lightweight towel to protect your skin.   Apply moist hot packs to your breast for 5 to 10 minutes before each feeding. This increases circulation and helps the milk flow.   Gently massage your breast before and during the feeding.   Make sure that the baby empties at least one breast at every feeding before switching sides.   Use a breast pump to empty the breasts if your baby is sleepy or not nursing well. You may also want to pump if you are returning to work or or you feel you are getting engorged.   Avoid bottle feeds, pacifiers or supplemental feedings of water or juice in place of breastfeeding.   Be sure the baby is latched on and positioned properly while  breastfeeding.   Prevent fatigue, stress, and anemia.   Wear a supportive bra, avoiding underwire styles.   Eat a balanced diet with enough fluids.  If you follow these suggestions, your engorgement should improve in 24 to 48 hours. If you are still experiencing difficulty, call your lactation consultant or caregiver. IS MY BABY GETTING ENOUGH MILK? Sometimes, mothers worry about whether their babies are getting enough milk. You can be assured that your baby is getting enough milk if:  The baby is actively sucking and you hear swallowing.   The baby nurses at least 8 to 12 times in a 24 hour time period. Nurse your baby until he or she unlatches or falls asleep at the first breast (at least 10 to 20 minutes), then offer the second side.   The baby is wetting 5 to 6 disposable diapers (6 to 8 cloth diapers) in a 24 hour period by 5 to 6 days of age.   The baby is having at least 2 to 3 stools every 24 hours for the first few months. Breast milk is all the food your baby needs. It is not necessary for your baby to have water or formula. In fact, to help your breasts make more milk, it is best not to give your baby supplemental feedings during the early weeks.   The stool should be soft and yellow.   The baby should gain 4 to 7 ounces per week after he is 4 days old.  TAKE CARE OF YOURSELF Take care of your breasts by:  Bathing or showering daily.   Avoiding the use of soaps on your nipples.   Start feedings on your left breast at one feeding and on your right breast at the next feeding.   You will notice an increase in your milk supply 2 to 5 days after delivery. You may feel some discomfort from engorgement, which makes your breasts very firm and often tender. Engorgement "peaks" out within 24 to 48 hours. In the meantime, apply warm moist towels to your breasts for 5 to 10 minutes before feeding. Gentle massage and expression of some milk before feeding will soften your breasts, making  it easier for your baby to latch on. Wear a well fitting nursing bra and air dry your nipples for 10 to 15 minutes after each feeding.   Only use cotton bra pads.   Only use pure lanolin on your nipples after nursing. You do not need to wash it off before nursing.  Take care of yourself by:   Eating well-balanced meals and nutritious snacks.   Drinking milk, fruit juice, and water to satisfy your thirst (about 8 glasses a day).   Getting plenty of rest.   Increasing calcium in   your diet (1200 mg a day).   Avoiding foods that you notice affect the baby in a bad way.  SEEK MEDICAL CARE IF:   You have any questions or difficulty with breastfeeding.   You need help.   You have a hard, red, sore area on your breast, accompanied by a fever of 100.5 F (38.1 C) or more.   Your baby is too sleepy to eat well or is having trouble sleeping.   Your baby is wetting less than 6 diapers per day, by 13 days of age.   Your baby's skin or white part of his or her eyes is more yellow than it was in the hospital.   You feel depressed.  Document Released: 11/17/2005 Document Revised: 11/06/2011 Document Reviewed: 07/02/2009 [facility] Patient Information 2012 [place], [place].Trial of Labor After Cesarean Information A trial of labor after cesarean (TOLAC) is when a woman tries to give birth vaginally after a previous cesarean delivery. When successful, this is called a vaginal birth after cesarean (VBAC). TOLAC may be a safe and appropriate option for you depending on your history and other risk factors. The chances of a successful VBAC depends on the reason you previously had a cesarean. Women have higher VBAC success rates if their cesarean was due to:   A breech (malpresentation) baby.   Emergency reasons.   Medical factors like hypertension.  Women have lower VBAC success rates if their cesarean was done because they:   Did not dilate.   Could not push their baby out.  Talk to your  caregiver about VBAC benefits, risks, and success rates. Discuss your plans for having more children. After this is done, you and your caregiver can decide whether to attempt TOLAC.  MOST SUCCESSFUL CANDIDATES FOR TOLAC TOLAC is possible for some women who:  Had 1 past horizontal (low transverse) incision during a cesarean.   Are carrying twins and had 1 past low transverse incision during a cesarean.   Do not have a very large (macrosomic) baby.   Do not have a vertical (classical) uterine scar.   Are in labor and are less than [redacted] weeks pregnant.  TOLAC is also supported for women who meet appropriate criteria and:  Are under the age of 82.   Are tall and have a body mass index (BMI) of less than 30.   Are likely carrying a baby that is at an average, or less than average, birth weight.   Have an unknown uterine scar.   Deliver in a hospital with a proper medical team. This team should be able to handle possible complications such as a uterine rupture.   Have thorough counseling about the benefits and risks of TOLAC.   Have discussed future pregnancy plans with their caregiver.   Plan to have several more pregnancies.  TOLAC may be most appropriate for women who meet the above guidelines and who plan to have more pregnancies. TOLAC is not recommended for home births. LEAST SUCCESSFUL CANDIDATES FOR TOLAC TOLAC is least successful for women who:  Have an induced labor with an unfavorable cervix. An unfavorable cervix is when the cerix is not dilating sufficiently (among other factors).   Have never had a vaginal delivery.   Had a past cesarean for failed progress.   Had a past cesarean due to abnormal fetal heart rate patterns on the monitor (nonrassuring tracing).   Have a macrosomic baby.   Are postterm. This means the pregancy has lasted beyond 42 weeks from the  first day of the last menstrual period.  There are no high-quality studies comparing the risks and benefits  of TOLAC and elective repeat cesarean deliveries. SUGGESTED BENEFITS OF TOLAC The benefits of TOLAC include:   Having a faster recovery time.   Having less pain than with a cesarean.   Having the partner involved in the delivery process.  SUGGESTED RISKS OF TOLAC The highest risk of complications happens to women who attempt a TOLAC and fail. A failed TOLAC results in an unplanned cesarean delivery. Risks related to [facility] or repeat cesarean surgeries include:   Blood loss.   Infection.   Blood clot.   Injury to surrounding tissues or organs.   Removal of the uterus (hysterectomy).   Potential problems with the placenta (placenta previa, placental acreta) in future pregnancies.  While very rare, the main concerns with TOLAC are:  Rupture of the uterine scar from a past cesarean surgery.   Needing an emergency cesarean surgery.   Having a bad outcome for the baby (perinatal morbidity).   Having closely spaced pregnancies (less than 6 months apart).  ADVANTAGES TO HAVING A REPEAT CESAREAN DELIVERY   It is convenient to be able to schedule a cesarean delivery.   A woman can easily have surgery to prevent future pregnancies (sterilization) during a cesarean, if desired.   There is a lower rate of hysterectomy later in life due to the uterus falling down (pelvic relaxation).   The risks associated with TOLAC are not applicable.  FOR MORE INFORMATION American Congress of Obstetricians and Gynecologists: www.acog.org [employer]: www.midwife.org Document Released: 08/05/2011 Document Revised: 11/06/2011 Document Reviewed: 08/05/2011 [facility] Patient Information 2012 [place], [place].

## 2012-04-22 ENCOUNTER — Encounter: Payer: Self-pay | Admitting: Obstetrics and Gynecology

## 2012-04-22 ENCOUNTER — Ambulatory Visit (INDEPENDENT_AMBULATORY_CARE_PROVIDER_SITE_OTHER): Payer: Self-pay | Admitting: Obstetrics and Gynecology

## 2012-04-22 VITALS — BP 99/63 | Temp 97.0°F | Wt 206.5 lb

## 2012-04-22 DIAGNOSIS — Z98891 History of uterine scar from previous surgery: Secondary | ICD-10-CM

## 2012-04-22 DIAGNOSIS — O0930 Supervision of pregnancy with insufficient antenatal care, unspecified trimester: Secondary | ICD-10-CM

## 2012-04-22 DIAGNOSIS — Z3490 Encounter for supervision of normal pregnancy, unspecified, unspecified trimester: Secondary | ICD-10-CM

## 2012-04-22 DIAGNOSIS — D259 Leiomyoma of uterus, unspecified: Secondary | ICD-10-CM

## 2012-04-22 DIAGNOSIS — Z9889 Other specified postprocedural states: Secondary | ICD-10-CM

## 2012-04-22 DIAGNOSIS — O3410 Maternal care for benign tumor of corpus uteri, unspecified trimester: Secondary | ICD-10-CM

## 2012-04-22 LAB — POCT URINALYSIS DIP (DEVICE)
Hgb urine dipstick: NEGATIVE
Protein, ur: NEGATIVE mg/dL
Specific Gravity, Urine: 1.02 (ref 1.005–1.030)
Urobilinogen, UA: 0.2 mg/dL (ref 0.0–1.0)

## 2012-04-22 NOTE — Progress Notes (Signed)
Patient doing well without complaints. Reports irregular contractions. FM/labor precautions reviewed

## 2012-04-22 NOTE — Progress Notes (Signed)
Pulse- 88 Edema- feet  Pain- "stomach"

## 2012-04-25 ENCOUNTER — Encounter (HOSPITAL_COMMUNITY): Payer: Self-pay

## 2012-04-25 ENCOUNTER — Inpatient Hospital Stay (HOSPITAL_COMMUNITY)
Admission: AD | Admit: 2012-04-25 | Discharge: 2012-04-25 | Disposition: A | Discharge status: Home / Self Care | Payer: Medicaid Other | Source: Ambulatory Visit | Attending: Obstetrics & Gynecology | Admitting: Obstetrics & Gynecology

## 2012-04-25 DIAGNOSIS — O3410 Maternal care for benign tumor of corpus uteri, unspecified trimester: Secondary | ICD-10-CM | POA: Insufficient documentation

## 2012-04-25 DIAGNOSIS — O479 False labor, unspecified: Secondary | ICD-10-CM

## 2012-04-25 DIAGNOSIS — Z3490 Encounter for supervision of normal pregnancy, unspecified, unspecified trimester: Secondary | ICD-10-CM

## 2012-04-25 DIAGNOSIS — Z3480 Encounter for supervision of other normal pregnancy, unspecified trimester: Secondary | ICD-10-CM

## 2012-04-25 DIAGNOSIS — D259 Leiomyoma of uterus, unspecified: Secondary | ICD-10-CM | POA: Insufficient documentation

## 2012-04-25 MED ORDER — ACETAMINOPHEN 500 MG PO TABS
1000.0000 mg | ORAL_TABLET | Freq: Once | ORAL | Status: AC
  Administered 2012-04-25: 1000 mg via ORAL
  Filled 2012-04-25: qty 2

## 2012-04-25 MED ORDER — HYDROXYZINE PAMOATE 50 MG PO CAPS: 50.0000 mg | ORAL_CAPSULE | Freq: Every evening | ORAL | Status: DC | PRN

## 2012-04-25 NOTE — Discharge Instructions (Signed)

## 2012-04-26 ENCOUNTER — Inpatient Hospital Stay (HOSPITAL_COMMUNITY): Payer: Medicaid Other | Admitting: Anesthesiology

## 2012-04-26 ENCOUNTER — Encounter (HOSPITAL_COMMUNITY): Payer: Self-pay | Admitting: *Deleted

## 2012-04-26 ENCOUNTER — Inpatient Hospital Stay (HOSPITAL_COMMUNITY)
Admission: AD | Admit: 2012-04-26 | Discharge: 2012-04-26 | Disposition: A | Discharge status: Home / Self Care | Payer: Medicaid Other | Source: Ambulatory Visit | Attending: Obstetrics & Gynecology | Admitting: Obstetrics & Gynecology

## 2012-04-26 ENCOUNTER — Inpatient Hospital Stay (HOSPITAL_COMMUNITY)
Admission: AD | Admit: 2012-04-26 | Discharge: 2012-04-29 | DRG: 775 | Disposition: A | Discharge status: Home / Self Care | Payer: Medicaid Other | Source: Ambulatory Visit | Attending: Obstetrics & Gynecology | Admitting: Obstetrics & Gynecology

## 2012-04-26 ENCOUNTER — Encounter (HOSPITAL_COMMUNITY): Payer: Self-pay | Admitting: Anesthesiology

## 2012-04-26 DIAGNOSIS — D259 Leiomyoma of uterus, unspecified: Secondary | ICD-10-CM | POA: Diagnosis present

## 2012-04-26 DIAGNOSIS — O34599 Maternal care for other abnormalities of gravid uterus, unspecified trimester: Secondary | ICD-10-CM | POA: Diagnosis present

## 2012-04-26 DIAGNOSIS — O411090 Infection of amniotic sac and membranes, unspecified, unspecified trimester, not applicable or unspecified: Secondary | ICD-10-CM

## 2012-04-26 DIAGNOSIS — Z3490 Encounter for supervision of normal pregnancy, unspecified, unspecified trimester: Secondary | ICD-10-CM

## 2012-04-26 DIAGNOSIS — O34219 Maternal care for unspecified type scar from previous cesarean delivery: Secondary | ICD-10-CM

## 2012-04-26 DIAGNOSIS — O479 False labor, unspecified: Secondary | ICD-10-CM

## 2012-04-26 DIAGNOSIS — D4959 Neoplasm of unspecified behavior of other genitourinary organ: Secondary | ICD-10-CM | POA: Diagnosis present

## 2012-04-26 LAB — RAPID URINE DRUG SCREEN, HOSP PERFORMED
Barbiturates: NOT DETECTED
Benzodiazepines: NOT DETECTED
Cocaine: NOT DETECTED
Tetrahydrocannabinol: POSITIVE — AB

## 2012-04-26 LAB — CBC
MCHC: 33.9 g/dL (ref 30.0–36.0)
Platelets: 220 10*3/uL (ref 150–400)
RDW: 13.7 % (ref 11.5–15.5)

## 2012-04-26 MED ORDER — PHENYLEPHRINE 40 MCG/ML (10ML) SYRINGE FOR IV PUSH (FOR BLOOD PRESSURE SUPPORT)
80.0000 ug | PREFILLED_SYRINGE | INTRAVENOUS | Status: DC | PRN
  Filled 2012-04-26: qty 5

## 2012-04-26 MED ORDER — OXYTOCIN 20 UNITS IN LACTATED RINGERS INFUSION - SIMPLE: 125.0000 mL/h | Freq: Once | INTRAVENOUS | Status: DC

## 2012-04-26 MED ORDER — CITRIC ACID-SODIUM CITRATE 334-500 MG/5ML PO SOLN: 30.0000 mL | ORAL | Status: DC | PRN

## 2012-04-26 MED ORDER — LACTATED RINGERS IV SOLN: 500.0000 mL | Freq: Once | INTRAVENOUS | Status: DC

## 2012-04-26 MED ORDER — EPHEDRINE 5 MG/ML INJ
10.0000 mg | INTRAVENOUS | Status: DC | PRN
  Filled 2012-04-26: qty 4

## 2012-04-26 MED ORDER — FENTANYL 2.5 MCG/ML BUPIVACAINE 1/10 % EPIDURAL INFUSION (WH - ANES)
INTRAMUSCULAR | Status: DC | PRN
  Administered 2012-04-26: 12 mL/h via EPIDURAL

## 2012-04-26 MED ORDER — ACETAMINOPHEN 650 MG RE SUPP
650.0000 mg | RECTAL | Status: DC | PRN
  Administered 2012-04-26: 650 mg via RECTAL
  Filled 2012-04-26: qty 1

## 2012-04-26 MED ORDER — PHENYLEPHRINE 40 MCG/ML (10ML) SYRINGE FOR IV PUSH (FOR BLOOD PRESSURE SUPPORT): 80.0000 ug | PREFILLED_SYRINGE | INTRAVENOUS | Status: DC | PRN

## 2012-04-26 MED ORDER — ACETAMINOPHEN 325 MG PO TABS
650.0000 mg | ORAL_TABLET | ORAL | Status: DC | PRN
  Administered 2012-04-26: 650 mg via ORAL
  Filled 2012-04-26: qty 2

## 2012-04-26 MED ORDER — OXYTOCIN BOLUS FROM INFUSION
500.0000 mL | Freq: Once | INTRAVENOUS | Status: DC
  Administered 2012-04-27: 500 mL via INTRAVENOUS
  Filled 2012-04-26: qty 500

## 2012-04-26 MED ORDER — OXYCODONE-ACETAMINOPHEN 5-325 MG PO TABS: 1.0000 | ORAL_TABLET | ORAL | Status: DC | PRN

## 2012-04-26 MED ORDER — DIPHENHYDRAMINE HCL 50 MG/ML IJ SOLN: 12.5000 mg | INTRAMUSCULAR | Status: DC | PRN

## 2012-04-26 MED ORDER — LACTATED RINGERS IV SOLN
INTRAVENOUS | Status: DC
  Administered 2012-04-26: 1000 mL via INTRAVENOUS
  Administered 2012-04-26: 23:00:00 via INTRAVENOUS

## 2012-04-26 MED ORDER — EPHEDRINE 5 MG/ML INJ: 10.0000 mg | INTRAVENOUS | Status: DC | PRN

## 2012-04-26 MED ORDER — NALBUPHINE SYRINGE 5 MG/0.5 ML
5.0000 mg | INJECTION | INTRAMUSCULAR | Status: DC | PRN
  Administered 2012-04-26: 10 mg via INTRAVENOUS
  Filled 2012-04-26: qty 1

## 2012-04-26 MED ORDER — GENTAMICIN SULFATE 40 MG/ML IJ SOLN
190.0000 mg | Freq: Once | INTRAVENOUS | Status: AC
  Administered 2012-04-26: 190 mg via INTRAVENOUS
  Filled 2012-04-26: qty 4.75

## 2012-04-26 MED ORDER — GENTAMICIN SULFATE 40 MG/ML IJ SOLN: 170.0000 mg | Freq: Three times a day (TID) | INTRAVENOUS | Status: DC

## 2012-04-26 MED ORDER — SODIUM CHLORIDE 0.9 % IV SOLN
2.0000 g | Freq: Four times a day (QID) | INTRAVENOUS | Status: DC
  Administered 2012-04-26: 2 g via INTRAVENOUS
  Filled 2012-04-26: qty 2000

## 2012-04-26 MED ORDER — FENTANYL 2.5 MCG/ML BUPIVACAINE 1/10 % EPIDURAL INFUSION (WH - ANES)
14.0000 mL/h | INTRAMUSCULAR | Status: DC
  Administered 2012-04-26: 14 mL/h via EPIDURAL
  Filled 2012-04-26 (×2): qty 60

## 2012-04-26 MED ORDER — ONDANSETRON HCL 4 MG/2ML IJ SOLN: 4.0000 mg | Freq: Four times a day (QID) | INTRAMUSCULAR | Status: DC | PRN

## 2012-04-26 MED ORDER — LACTATED RINGERS IV SOLN
500.0000 mL | INTRAVENOUS | Status: DC | PRN
  Administered 2012-04-26: 1000 mL via INTRAVENOUS

## 2012-04-26 MED ORDER — LIDOCAINE HCL (PF) 1 % IJ SOLN
30.0000 mL | INTRAMUSCULAR | Status: DC | PRN
  Filled 2012-04-26: qty 30

## 2012-04-26 MED ORDER — OXYTOCIN 20 UNITS IN LACTATED RINGERS INFUSION - SIMPLE
1.0000 m[IU]/min | INTRAVENOUS | Status: DC
  Administered 2012-04-26: 1 m[IU]/min via INTRAVENOUS
  Filled 2012-04-26: qty 1000

## 2012-04-26 MED ORDER — FLEET ENEMA 7-19 GM/118ML RE ENEM: 1.0000 | ENEMA | RECTAL | Status: DC | PRN

## 2012-04-26 MED ORDER — LIDOCAINE HCL (PF) 1 % IJ SOLN
INTRAMUSCULAR | Status: DC | PRN
  Administered 2012-04-26 (×2): 8 mL

## 2012-04-26 MED ORDER — TERBUTALINE SULFATE 1 MG/ML IJ SOLN: 0.2500 mg | Freq: Once | INTRAMUSCULAR | Status: AC | PRN

## 2012-04-26 MED ORDER — NALBUPHINE HCL 10 MG/ML IJ SOLN
10.0000 mg | Freq: Once | INTRAMUSCULAR | Status: AC
  Administered 2012-04-26: 10 mg via INTRAMUSCULAR
  Filled 2012-04-26: qty 1

## 2012-04-26 MED ORDER — IBUPROFEN 600 MG PO TABS: 600.0000 mg | ORAL_TABLET | Freq: Four times a day (QID) | ORAL | Status: DC | PRN

## 2012-04-26 NOTE — MAU Note (Signed)
Pt assisted to rm via wc. Pt vocal, and breathing hard (almost hyperventilating) pt did respond to instruction, encouraged to slow breathing.  Was here during the night, ctx's closer and stronger.  Started leaking about ago, reported at brown and black.

## 2012-04-26 NOTE — Progress Notes (Signed)
UCs continue to be spaced out  FHR stable.  Will start Pitocin Augmentation at 1 X 1  Continue to observe

## 2012-04-26 NOTE — Anesthesia Preprocedure Evaluation (Signed)
Anesthesia Evaluation  Patient identified by MRN, date of birth, ID band Patient awake    Reviewed: Allergy & Precautions, H&P , NPO status , Patient's Chart, lab work & pertinent test results  Airway Mallampati: III TM Distance: >3 FB Neck ROM: full    Dental No notable dental hx.    Pulmonary neg pulmonary ROS,          Cardiovascular negative cardio ROS      Neuro/Psych negative neurological ROS  negative psych ROS   GI/Hepatic negative GI ROS, Neg liver ROS,   Endo/Other  Morbid obesity  Renal/GU negative Renal ROS  negative genitourinary   Musculoskeletal negative musculoskeletal ROS (+)   Abdominal (+) + obese,   Peds negative pediatric ROS (+)  Hematology negative hematology ROS (+)   Anesthesia Other Findings   Reproductive/Obstetrics (+) Pregnancy                           Anesthesia Physical Anesthesia Plan  ASA: III  Anesthesia Plan: Epidural   Post-op Pain Management:    Induction:   Airway Management Planned:   Additional Equipment:   Intra-op Plan:   Post-operative Plan:   Informed Consent: I have reviewed the patients History and Physical, chart, labs and discussed the procedure including the risks, benefits and alternatives for the proposed anesthesia with the patient or authorized representative who has indicated his/her understanding and acceptance.     Plan Discussed with:   Anesthesia Plan Comments:         Anesthesia Quick Evaluation

## 2012-04-26 NOTE — Progress Notes (Signed)
Doing well.  Comfortable with epidural  FHR stable. UCs spaced to q 4-6 minutes  May need to start Pitocin later.

## 2012-04-26 NOTE — Progress Notes (Signed)
Michele Boyd is a 21 y.o. G2P1001 at [redacted]w[redacted]d admitted for active labor, rupture of membranes  Subjective: Pt now with epidural.  Feeling more comfortable  Objective: BP 98/68  Pulse 91  Temp(Src) 98.4 F (36.9 C) (Oral)  Resp 18  SpO2 96%  LMP 12/16/2011   Total I/O In: -  Out: 300 [Urine:300]  FHT:  FHR: 140 bpm, variability: moderate,  accelerations:  Present,  decelerations:  Absent UC:   regular, every 5-7 minutes SVE:   Dilation: 6.5 Effacement (%): 100 Station: +2;+1 Exam by:: D herr rn  Labs: Lab Results  Component Value Date   WBC 13.7* 04/26/2012   HGB 13.1 04/26/2012   HCT 38.7 04/26/2012   MCV 91.5 04/26/2012   PLT 220 04/26/2012    Assessment / Plan: Spontaneous labor, progressing normally  Labor: Progressing normally and TOLAC, consider augmentation with low dose Pitocin if continues to have contrations q 5 mintues and minimal progression of cervical dilation Preeclampsia:  n/a Fetal Wellbeing:  Category I Pain Control:  Epidural and s/p 1 nubain I/D:  n/a Anticipated MOD:  NSVD  - low threshold for repeat LTCS  [name], DO [facility] Resident - PGY-1 04/26/2012 6:06 PM

## 2012-04-26 NOTE — Anesthesia Procedure Notes (Signed)
Epidural Patient location during procedure: OB Start time: 04/26/2012 4:26 PM End time: 04/26/2012 4:31 PM Reason for block: procedure for pain  Staffing Anesthesiologist: Sandrea Hughs  Preanesthetic Checklist Completed: patient identified, site marked, surgical consent, pre-op evaluation, timeout performed, IV checked, risks and benefits discussed and monitors and equipment checked  Epidural Patient position: sitting Prep: site prepped and draped and DuraPrep Patient monitoring: continuous pulse ox and blood pressure Approach: midline Injection technique: LOR air  Needle:  Needle type: Tuohy  Needle gauge: 17 G Needle length: 9 cm Needle insertion depth: 8 cm Catheter type: closed end flexible Catheter size: 19 Gauge Catheter at skin depth: 14 cm Test dose: negative and Other  Assessment Sensory level: T9 Events: blood not aspirated, injection not painful, no injection resistance, negative IV test and no paresthesia

## 2012-04-26 NOTE — MAU Note (Signed)
After consultation with CNM, pt clarified that she was only in a lot of pain & does not want to kill herself.  Pt discussed with CNM possibility of having c/s & will discuss @ next MD visit.  Pt told CNM that she would like to have pain meds & agrees with plan for pain meds & d/c home.

## 2012-04-26 NOTE — MAU Note (Signed)
Pt presents with complaints of contractions that "hurt really bad", G2P1, , first preg was C/S for breech, plans vaginal delivery

## 2012-04-26 NOTE — Progress Notes (Signed)
ANTIBIOTIC CONSULT NOTE - INITIAL  Pharmacy Consult for gentamicin Indication: maternal fever; R/O chorioamniotitis  No Known Allergies  Patient Measurements:  Wt = 210 lb   Ht= 62 in LBW= 50.1 kg Adjusted Body Weight: 65 kg  Vital Signs: Temp: 101.1 F (38.4 C) (05/27 2301) Temp src: Axillary (05/27 2301) BP: 116/70 mmHg (05/27 2301) Pulse Rate: 97  (05/27 2301) Intake/Output from previous day:   Intake/Output from this shift:    Labs:  Basename 04/26/12 1509  WBC 13.7*  HGB 13.1  PLT 220  LABCREA --  CREATININE --   CrCl is unknown because no creatinine reading has been taken.    Microbiology: Recent Results (from the past 720 hour(s))  OB RESULTS CONSOLE GBS     Status: Normal      Component Value Range Status Comment   GBS Negative        Medical History: Past Medical History  Diagnosis Date  . Fibroid     Medications:    Ampicillin 2gm IV q6h  Assessment: Coverage for infection of presumed pelvic origin; R/O chorio  Goal of Therapy:  Desire peak gentamicin serum level approx 6.5-61mcg/ml & trough level <36mcg/ml  Plan:  1.  Loading dose = 190mg  2.  Plan maintenance regimen of Gentamicin 170mg  IV q8h  3.  Will check serum creatinine if maintenance regimen started 4.  Will check actual serum gentamicin levels if therapy continued >48-72hr or as clinically indicated     Scarlett Presto 04/26/2012,11:21 PM

## 2012-04-26 NOTE — H&P (Signed)
Seen by me Agree with note

## 2012-04-26 NOTE — MAU Note (Signed)
Pt disagrees with plan for Nubain IM & d/c home.  Pt crying & stating that she will go home & kill herself if not admitted & that she wants a rpt c/s right now. CNM notified & will come to see pt.

## 2012-04-26 NOTE — H&P (Addendum)
Michele Boyd is a 21 y.o. female presenting for spontaneous onset of labor.  Maternal Medical History:  Reason for admission: Reason for admission: rupture of membranes and contractions.  Contractions: Onset was 6-12 hours ago.  Frequency: regular.  Perceived severity is strong.  Fetal activity: Perceived fetal activity is normal.  Prenatal complications: No bleeding, cholelithiasis, HIV, hypertension, infection, IUGR, nephrolithiasis, oligohydramnios, placental abnormality, polyhydramnios, pre-eclampsia, preterm labor, substance abuse, thrombocytopenia or thrombophilia.  Prenatal Complications - Diabetes: none.  Clinic = Low Risk  Genetic Screen  to late to care   Anatomic US  Normal   Glucose Screen  78   GC / Chlamydia    GBS  Negative   Feeding Preference  Breast   Contraception  Depo   Circumcision    Late to prenatal care, first visit to MAU at 28 weeks  Her obstetrical history is significant for late to care, prev C/S and short interval between pregnnacies., large fiboid (8X9X9cm) bleeding 2nd trimester although pt denies this today during interview.  OB History    Grav  Para  Term  Preterm  Abortions  TAB  SAB  Ect  Mult  Living    2  1  1        1      Past Medical History   Diagnosis  Date   .  Fibroid     Past Surgical History   Procedure  Date   .  Cesarean section    .  Wisdom tooth extraction     Family History: family history is negative for Anesthesia problems.  Social History: reports that she has been smoking Cigarettes. She has never used smokeless tobacco. She reports that she does not drink alcohol or use illicit drugs.  Review of Systems  Constitutional: Negative for fever and chills.  HENT: Negative. Negative for congestion.  Eyes: Negative.  Respiratory: Negative for cough.  Cardiovascular: Negative for leg swelling.  Gastrointestinal: Negative.  Genitourinary: Negative.  Musculoskeletal: Negative.  Skin: Negative.  Neurological: Negative.    Endo/Heme/Allergies: Negative.  Psychiatric/Behavioral: Negative.    Last menstrual period 12/16/2011. - had bleeding during 2nd trimester  Maternal Exam:  Uterine Assessment: Contraction strength is moderate. Contraction frequency is regular.  Abdomen: Patient reports generalized tenderness.  Surgical scars: low transverse.  Fundal height is appropriate for dates.  Fetal presentation: vertex  Introitus: Normal vulva. Normal vagina. Ferning test: not done.  Nitrazine test: not done.  Pelvis: adequate for delivery.  Cervix: Cervix evaluated by digital exam.  Fetal Exam  Fetal Monitor Review: Baseline rate: 130-150s.  Variability: moderate (6-25 bpm).  Pattern: accelerations present.  Fetal State Assessment: Category I - tracings are normal.   Physical Exam  Constitutional: She appears well-developed and well-nourished. She appears distressed.  HENT:  Head: Normocephalic and atraumatic.  Eyes: Right eye exhibits no discharge. Left eye exhibits no discharge. No scleral icterus.  Cardiovascular: Normal rate, regular rhythm and intact distal pulses. Exam reveals friction rub. Exam reveals no gallop.  No murmur heard.  Respiratory: Breath sounds normal. No respiratory distress. She has no wheezes. She has no rales. She exhibits no tenderness.  tachypenic  GI: She exhibits distension and mass. There is generalized tenderness. There is guarding. There is no rebound.  Genitourinary: Vagina normal and uterus normal.  Musculoskeletal: Normal range of motion.  Neurological: She is alert.  Skin: Skin is warm and dry. No rash noted. She is not diaphoretic. No erythema. No pallor.  Psychiatric: Judgment and thought   content normal.  Distressed, in pain,  Dilation: 5  Effacement (%): 100  Cervical Position: Middle  Station: +2  Presentation: Vertex  Exam by:: J. Spurlock-Frizzell, RN  Prenatal labs:  ABO, Rh: O positive  Antibody:  Rubella: Drawn at time of admission  RPR: Drawn at  time of admission  HBsAg: Drawn at time of admission  HIV: Drawn at time of admission  GBS: Negative (04/29 0000)  Assessment/Plan:  To L&D for Trial of Labor after Caesarian  Epidural and IV nubain for pain control  OB labs pending at time of admission  Noted Large Single Uterine Fibroid - 8X9X9cm  Dr. Eure aware of pt.  Michael D Rigby, DO  [facility] Resident - PGY-1  04/26/2012 3:11 PM  

## 2012-04-26 NOTE — MAU Provider Note (Signed)
  History     CSN: [id number]  Arrival date and time: 04/26/12 0109   None     Chief Complaint  Patient presents with  . Labor Eval   HPIIn with c/o contractions.  OB History    Grav Para Term Preterm Abortions TAB SAB Ect Mult Living   2 1 1       1       Past Medical History  Diagnosis Date  . Fibroid     Past Surgical History  Procedure Date  . Cesarean section   . Wisdom tooth extraction     Family History  Problem Relation Age of Onset  . Anesthesia problems Neg Hx     History  Substance Use Topics  . Smoking status: Current Some Day Smoker    Types: Cigarettes  . Smokeless tobacco: Never Used  . Alcohol Use: No    Allergies: No Known Allergies  Prescriptions prior to admission  Medication Sig Dispense Refill  . hydrOXYzine (VISTARIL) 50 MG capsule Take 1 capsule (50 mg total) by mouth at bedtime as needed (For early labor).  5 capsule  0  . Pediatric Multiple Vit-C-FA (FLINSTONES GUMMIES OMEGA-3 DHA) CHEW Chew 1 tablet by mouth daily.        Review of Systems  Constitutional: Negative.   HENT: Negative.   Eyes: Negative.   Respiratory: Negative.   Cardiovascular: Negative.   Gastrointestinal: Positive for abdominal pain.  Genitourinary: Negative.   Musculoskeletal: Negative.   Skin: Negative.   Neurological: Negative.   Endo/Heme/Allergies: Negative.   Psychiatric/Behavioral: Negative.    Physical Exam   Blood pressure 111/63, pulse 71, temperature 98.3 F (36.8 C), temperature source Oral, resp. rate 20, height 5\' 1"  (1.549 m), weight 210 lb (95.255 kg), last menstrual period 12/16/2011, SpO2 100.00%.  Physical Exam  Constitutional: She is oriented to person, place, and time. She appears well-developed and well-nourished.  HENT:  Head: Normocephalic.  Eyes: Pupils are equal, round, and reactive to light.  Cardiovascular: Normal rate, regular rhythm, normal heart sounds and intact distal pulses.   Respiratory: Effort normal and breath  sounds normal.  GI: Soft. Bowel sounds are normal.  Genitourinary: Vagina normal and uterus normal.  Musculoskeletal: Normal range of motion.  Neurological: She is alert and oriented to person, place, and time. She has normal reflexes.  Skin: Skin is warm and dry.  Psychiatric: She has a normal mood and affect. Her behavior is normal. Judgment and thought content normal.    MAU Course  Procedures  MDM   Assessment and Plan  SVE 2/80/-2 no change since previous exam. Discuss options with pt, she will take IM pain med and d/c home.  Ottavio Norem DARLENE 04/26/2012, 2:03 AM

## 2012-04-27 ENCOUNTER — Encounter (HOSPITAL_COMMUNITY): Payer: Self-pay | Admitting: *Deleted

## 2012-04-27 LAB — RPR: RPR Ser Ql: NONREACTIVE

## 2012-04-27 LAB — CORD BLOOD GAS (ARTERIAL)

## 2012-04-27 LAB — HEPATITIS B SURFACE ANTIGEN: Hepatitis B Surface Ag: NEGATIVE

## 2012-04-27 MED ORDER — SIMETHICONE 80 MG PO CHEW
80.0000 mg | CHEWABLE_TABLET | ORAL | Status: DC | PRN
  Administered 2012-04-27: 80 mg via ORAL

## 2012-04-27 MED ORDER — WITCH HAZEL-GLYCERIN EX PADS: 1.0000 "application " | MEDICATED_PAD | CUTANEOUS | Status: DC | PRN

## 2012-04-27 MED ORDER — PRENATAL MULTIVITAMIN CH
1.0000 | ORAL_TABLET | Freq: Every day | ORAL | Status: DC
  Administered 2012-04-27 – 2012-04-28 (×2): 1 via ORAL
  Filled 2012-04-27 (×4): qty 1

## 2012-04-27 MED ORDER — ZOLPIDEM TARTRATE 5 MG PO TABS: 5.0000 mg | ORAL_TABLET | Freq: Every evening | ORAL | Status: DC | PRN

## 2012-04-27 MED ORDER — OXYCODONE-ACETAMINOPHEN 5-325 MG PO TABS
1.0000 | ORAL_TABLET | ORAL | Status: DC | PRN
  Administered 2012-04-27 – 2012-04-29 (×8): 1 via ORAL
  Filled 2012-04-27 (×9): qty 1

## 2012-04-27 MED ORDER — ONDANSETRON HCL 4 MG/2ML IJ SOLN: 4.0000 mg | INTRAMUSCULAR | Status: DC | PRN

## 2012-04-27 MED ORDER — SODIUM CHLORIDE 0.9 % IV SOLN
2.0000 g | Freq: Four times a day (QID) | INTRAVENOUS | Status: AC
  Administered 2012-04-27: 2 g via INTRAVENOUS
  Filled 2012-04-27: qty 2000

## 2012-04-27 MED ORDER — SENNOSIDES-DOCUSATE SODIUM 8.6-50 MG PO TABS
2.0000 | ORAL_TABLET | Freq: Every day | ORAL | Status: DC
  Administered 2012-04-27 – 2012-04-28 (×2): 2 via ORAL

## 2012-04-27 MED ORDER — DIBUCAINE 1 % RE OINT: 1.0000 "application " | TOPICAL_OINTMENT | RECTAL | Status: DC | PRN

## 2012-04-27 MED ORDER — LANOLIN HYDROUS EX OINT: TOPICAL_OINTMENT | CUTANEOUS | Status: DC | PRN

## 2012-04-27 MED ORDER — ONDANSETRON HCL 4 MG PO TABS: 4.0000 mg | ORAL_TABLET | ORAL | Status: DC | PRN

## 2012-04-27 MED ORDER — DIPHENHYDRAMINE HCL 25 MG PO CAPS: 25.0000 mg | ORAL_CAPSULE | Freq: Four times a day (QID) | ORAL | Status: DC | PRN

## 2012-04-27 MED ORDER — GENTAMICIN SULFATE 40 MG/ML IJ SOLN
170.0000 mg | Freq: Three times a day (TID) | INTRAVENOUS | Status: AC
  Administered 2012-04-27: 170 mg via INTRAVENOUS
  Filled 2012-04-27: qty 4.25

## 2012-04-27 MED ORDER — TETANUS-DIPHTH-ACELL PERTUSSIS 5-2.5-18.5 LF-MCG/0.5 IM SUSP: 0.5000 mL | Freq: Once | INTRAMUSCULAR | Status: DC

## 2012-04-27 MED ORDER — IBUPROFEN 600 MG PO TABS
600.0000 mg | ORAL_TABLET | Freq: Four times a day (QID) | ORAL | Status: DC
  Administered 2012-04-27 – 2012-04-29 (×9): 600 mg via ORAL
  Filled 2012-04-27 (×9): qty 1

## 2012-04-27 MED ORDER — BENZOCAINE-MENTHOL 20-0.5 % EX AERO: 1.0000 "application " | INHALATION_SPRAY | CUTANEOUS | Status: DC | PRN

## 2012-04-27 NOTE — Progress Notes (Signed)
UR chart review completed.  

## 2012-04-27 NOTE — Anesthesia Postprocedure Evaluation (Signed)
  Anesthesia Post-op Note  Patient: Michele Boyd  Procedure(s) Performed: * No surgery found *  Patient Location: Mother/Baby  Anesthesia Type: Epidural  Level of Consciousness: awake  Airway and Oxygen Therapy: Patient Spontanous Breathing  Post-op Pain: none  Post-op Assessment: Patient's Cardiovascular Status Stable, Respiratory Function Stable, Patent Airway, No signs of Nausea or vomiting, Adequate PO intake, Pain level controlled, No headache, No backache, No residual numbness and No residual motor weakness  Post-op Vital Signs: Reviewed and stable  Complications: No apparent anesthesia complications

## 2012-04-27 NOTE — Progress Notes (Signed)
FHR stable with small early decels.  UCs improved to every 3 minutes  Cervix per RN:  Dilation: 10 Dilation Complete Date: 04/26/12 Dilation Complete Time: 2350 Effacement (%): 90 Cervical Position: Anterior Station: +1 Presentation: Vertex Exam by:: [email or web address]-[email or web address]  Will start pushing and prepare for VBAC delivery

## 2012-04-28 NOTE — Progress Notes (Signed)
Post Partum Day #1 Subjective: no complaints and bottlefeeding; desires Nexplanon for contraception; declines early d/c  Objective: Blood pressure 106/70, pulse 65, temperature 97.5 F (36.4 C), temperature source Oral, resp. rate 18, height 5\' 2"  (1.575 m), weight 95.255 kg (210 lb), last menstrual period 12/16/2011, SpO2 96.00%, unknown if currently breastfeeding.  Physical Exam:  General: alert and no distress Lochia: appropriate Uterine Fundus: firm DVT Evaluation: No evidence of DVT seen on physical exam.   Basename 04/26/12 1509  HGB 13.1  HCT 38.7    Assessment/Plan: Plan for discharge tomorrow   LOS: 2 days   Cam Hai 04/28/2012, 7:50 AM
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
## 2012-04-29 ENCOUNTER — Encounter: Payer: Self-pay | Admitting: Obstetrics and Gynecology

## 2012-04-29 DIAGNOSIS — O34219 Maternal care for unspecified type scar from previous cesarean delivery: Secondary | ICD-10-CM

## 2012-04-29 MED ORDER — OXYCODONE-ACETAMINOPHEN 5-325 MG PO TABS: 1.0000 | ORAL_TABLET | ORAL | Status: AC | PRN

## 2012-04-29 MED ORDER — IBUPROFEN 600 MG PO TABS: 600.0000 mg | ORAL_TABLET | Freq: Four times a day (QID) | ORAL | Status: AC

## 2012-04-29 NOTE — Discharge Summary (Signed)
Obstetric Discharge Summary Reason for Admission: onset of labor Prenatal Procedures: ultrasound Intrapartum Procedures: spontaneous vaginal delivery Postpartum Procedures: antibiotics for chorioamnionitis Complications-Operative and Postpartum: none Hemoglobin  Date Value Range Status  04/26/2012 13.1  12.0-15.0 (g/dL) Final     HCT  Date Value Range Status  04/26/2012 38.7  36.0-46.0 (%) Final    Physical Exam:  General: alert, cooperative and no distress Lochia: appropriate Uterine Fundus: firm, -1 Incision: N/A DVT Evaluation: No evidence of DVT seen on physical exam. Negative Homan's sign. No cords or calf tenderness.  Discharge Diagnoses: Term Pregnancy-delivered  Discharge Information: Date: 04/29/2012 Activity: pelvic rest Diet: routine Medications: Ibuprofen and Percocet   Condition: stable Instructions: refer to practice specific booklet Discharge to: home Follow-up Information    Please follow up. (Follow up with clinic in 5-6 weeks.  )          Newborn Data: Live born female  Birth Weight: 5 lb 8 oz (2495 g) APGAR: 8, 9  Home with mother.  LEFTWICH-KIRBY, Helayne Metsker 04/29/2012, 9:08 AM

## 2012-04-29 NOTE — Discharge Summary (Signed)
Agree with above note.  Emmerich Cryer H. 04/29/2012 8:26 PM

## 2012-04-29 NOTE — Discharge Instructions (Signed)

## 2012-05-03 NOTE — H&P (Signed)
Michele Boyd is a 21 y.o. female presenting for spontaneous onset of labor.  Maternal Medical History:  Reason for admission: Reason for admission: rupture of membranes and contractions.  Contractions: Onset was 6-12 hours ago.  Frequency: regular.  Perceived severity is strong.  Fetal activity: Perceived fetal activity is normal.  Prenatal complications: No bleeding, cholelithiasis, HIV, hypertension, infection, IUGR, nephrolithiasis, oligohydramnios, placental abnormality, polyhydramnios, pre-eclampsia, preterm labor, substance abuse, thrombocytopenia or thrombophilia.  Prenatal Complications - Diabetes: none.  Clinic = Low Risk  Genetic Screen  to late to care   Anatomic [place]  Normal   Glucose Screen  78   GC / Chlamydia    GBS  Negative   Feeding Preference  Breast   Contraception  Depo   Circumcision    Late to prenatal care, first visit to MAU at 28 weeks  Her obstetrical history is significant for late to care, prev C/S and short interval between pregnnacies., large fiboid (8X9X9cm) bleeding 2nd trimester although pt denies this today during interview.  OB History    Grav  Para  Term  Preterm  Abortions  TAB  SAB  Ect  Mult  Living    2  1  1        1       Past Medical History   Diagnosis  Date   .  Fibroid     Past Surgical History   Procedure  Date   .  Cesarean section    .  Wisdom tooth extraction     Family History: family history is negative for Anesthesia problems.  Social History: reports that she has been smoking Cigarettes. She has never used smokeless tobacco. She reports that she does not drink alcohol or use illicit drugs.  Review of Systems  Constitutional: Negative for fever and chills.  HENT: Negative. Negative for congestion.  Eyes: Negative.  Respiratory: Negative for cough.  Cardiovascular: Negative for leg swelling.  Gastrointestinal: Negative.  Genitourinary: Negative.  Musculoskeletal: Negative.  Skin: Negative.  Neurological: Negative.    Endo/Heme/Allergies: Negative.  Psychiatric/Behavioral: Negative.    Last menstrual period 12/16/2011. - had bleeding during 2nd trimester  Maternal Exam:  Uterine Assessment: Contraction strength is moderate. Contraction frequency is regular.  Abdomen: Patient reports generalized tenderness.  Surgical scars: low transverse.  Fundal height is appropriate for dates.  Fetal presentation: vertex  Introitus: Normal vulva. Normal vagina. Ferning test: not done.  Nitrazine test: not done.  Pelvis: adequate for delivery.  Cervix: Cervix evaluated by digital exam.  Fetal Exam  Fetal Monitor Review: Baseline rate: 130-150s.  Variability: moderate (6-25 bpm).  Pattern: accelerations present.  Fetal State Assessment: Category I - tracings are normal.   Physical Exam  Constitutional: She appears well-developed and well-nourished. She appears distressed.  HENT:  Head: Normocephalic and atraumatic.  Eyes: Right eye exhibits no discharge. Left eye exhibits no discharge. No scleral icterus.  Cardiovascular: Normal rate, regular rhythm and intact distal pulses. Exam reveals friction rub. Exam reveals no gallop.  No murmur heard.  Respiratory: Breath sounds normal. No respiratory distress. She has no wheezes. She has no rales. She exhibits no tenderness.  tachypenic  GI: She exhibits distension and mass. There is generalized tenderness. There is guarding. There is no rebound.  Genitourinary: Vagina normal and uterus normal.  Musculoskeletal: Normal range of motion.  Neurological: She is alert.  Skin: Skin is warm and dry. No rash noted. She is not diaphoretic. No erythema. No pallor.  Psychiatric: Judgment and thought  content normal.  Distressed, in pain,  Dilation: 5  Effacement (%): 100  Cervical Position: Middle  Station: +2  Presentation: Vertex  Exam by:: J. Spurlock-Frizzell, RN  Prenatal labs:  ABO, Rh: O positive  Antibody:  Rubella: Drawn at time of admission  RPR: Drawn at  time of admission  HBsAg: Drawn at time of admission  HIV: Drawn at time of admission  GBS: Negative (04/29 0000)  Assessment/Plan:  To L&D for Trial of Labor after Caesarian  Epidural and IV nubain for pain control  OB labs pending at time of admission  Noted Large Single Uterine Fibroid - 8X9X9cm  Dr. Despina Hidden aware of pt.  [name], DO  [facility] Resident - PGY-1  04/26/2012 3:11 PM

## 2012-05-05 NOTE — H&P (Signed)
Attestation of Attending Supervision of Advanced Practitioner: Evaluation and management procedures were performed by the OB Fellow/PA/CNM/NP under my supervision and collaboration. Chart reviewed, and agree with management and plan.  Jodette Wik, M.D. 05/05/2012 12:36 PM   

## 2012-05-26 ENCOUNTER — Ambulatory Visit: Payer: Self-pay | Admitting: Advanced Practice Midwife

## 2012-06-16 ENCOUNTER — Emergency Department (HOSPITAL_COMMUNITY): Payer: Medicaid Other

## 2012-06-16 ENCOUNTER — Emergency Department (HOSPITAL_COMMUNITY)
Admission: EM | Admit: 2012-06-16 | Discharge: 2012-06-16 | Disposition: A | Discharge status: Home / Self Care | Payer: Medicaid Other | Attending: Emergency Medicine | Admitting: Emergency Medicine

## 2012-06-16 ENCOUNTER — Encounter (HOSPITAL_COMMUNITY): Payer: Self-pay | Admitting: *Deleted

## 2012-06-16 DIAGNOSIS — S99919A Unspecified injury of unspecified ankle, initial encounter: Secondary | ICD-10-CM | POA: Insufficient documentation

## 2012-06-16 DIAGNOSIS — S8990XA Unspecified injury of unspecified lower leg, initial encounter: Secondary | ICD-10-CM | POA: Insufficient documentation

## 2012-06-16 DIAGNOSIS — Y92009 Unspecified place in unspecified non-institutional (private) residence as the place of occurrence of the external cause: Secondary | ICD-10-CM | POA: Insufficient documentation

## 2012-06-16 DIAGNOSIS — F17200 Nicotine dependence, unspecified, uncomplicated: Secondary | ICD-10-CM | POA: Insufficient documentation

## 2012-06-16 DIAGNOSIS — W108XXA Fall (on) (from) other stairs and steps, initial encounter: Secondary | ICD-10-CM | POA: Insufficient documentation

## 2012-06-16 NOTE — ED Notes (Signed)
Ortho tech on the way.  

## 2012-06-16 NOTE — Progress Notes (Signed)
Orthopedic Tech Progress Note Patient Details:  Michele Boyd 12-Jan-1991 [id number]  Ortho Devices Type of Ortho Device: Knee Immobilizer;Crutches Ortho Device/Splint Location: right LE Ortho Device/Splint Interventions: Application   Aleksey Newbern T 06/16/2012, 5:17 PM

## 2012-06-16 NOTE — ED Provider Notes (Signed)
History   This chart was scribed for Nelia Shi, MD by Sofie Rower. The patient was seen in room TR11C/TR11C and the patient's care was started at 4:52 PM     CSN: [id number]  Arrival date & time 06/16/12  1531   None     Chief Complaint  Patient presents with  . Knee Pain    (Consider location/radiation/quality/duration/timing/severity/associated sxs/prior treatment) Patient is a 21 y.o. female presenting with knee pain. The history is provided by the patient. No language interpreter was used.  Knee Pain This is a new problem. The current episode started 3 to 5 hours ago. The problem occurs constantly. The problem has not changed since onset.Pertinent negatives include no chest pain and no shortness of breath. The symptoms are aggravated by walking. Nothing relieves the symptoms. She has tried rest for the symptoms. The treatment provided no relief.    Michele Boyd is a 21 y.o. female who presents to the Emergency Department complaining of moderate, episodic knee pain located at the right knee onset today with associated symptoms of swelling. The pt informs the EDP that she was rushing out of the house for work, where she tripped over her boyfriends shoes and fell down one stair. Modifying factors include walking which intensifies the knee pain.  Pt works at [employer].     Past Medical History  Diagnosis Date  . Fibroid     Past Surgical History  Procedure Date  . Cesarean section   . Wisdom tooth extraction     Family History  Problem Relation Age of Onset  . Anesthesia problems Neg Hx     History  Substance Use Topics  . Smoking status: Current Some Day Smoker    Types: Cigarettes  . Smokeless tobacco: Never Used  . Alcohol Use: No    OB History    Grav Para Term Preterm Abortions TAB SAB Ect Mult Living   2 2 2       2       Review of Systems  Respiratory: Negative for shortness of breath.   Cardiovascular: Negative for chest pain.  All other  systems reviewed and are negative.    10 Systems reviewed and all are negative for acute change except as noted in the HPI.    Allergies  Review of patient's allergies indicates no known allergies.  Home Medications   Current Outpatient Rx  Name Route Sig Dispense Refill  . IBUPROFEN 200 MG PO TABS Oral Take 400 mg by mouth every 6 (six) hours as needed. For fever/pain      BP 99/55  Pulse 56  Temp 98.8 F (37.1 C) (Oral)  Resp 16  SpO2 98%  LMP 08/02/2011  Breastfeeding? No  Physical Exam  Nursing note and vitals reviewed. Constitutional: She is oriented to person, place, and time. She appears well-developed and well-nourished. No distress.  HENT:  Head: Normocephalic and atraumatic.  Right Ear: External ear normal.  Left Ear: External ear normal.  Nose: Nose normal.  Eyes: Pupils are equal, round, and reactive to light.  Neck: Normal range of motion.  Cardiovascular: Normal rate and intact distal pulses.   Pulmonary/Chest: Effort normal. No respiratory distress.  Abdominal: Normal appearance. She exhibits no distension.  Musculoskeletal:       Right knee: She exhibits decreased range of motion and swelling. She exhibits no deformity.       Legs: Neurological: She is alert and oriented to person, place, and time. No cranial nerve deficit.  Skin: Skin is warm and dry. No rash noted.  Psychiatric: She has a normal mood and affect. Her behavior is normal.    ED Course  Procedures (including critical care time)  DIAGNOSTIC STUDIES: Oxygen Saturation is 98% on room air, normal by my interpretation.    COORDINATION OF CARE:    4:54PM- EDP at bedside discusses treatment plan concerning application of a brace, pain management.   Labs Reviewed - No data to display Dg Knee Complete 4 Views Right  06/16/2012  *RADIOLOGY REPORT*  Clinical Data: Twisting injury, knee pain.  RIGHT KNEE - COMPLETE 4+ VIEW  Comparison: None.  Findings: No acute bony abnormality.   Specifically, no fracture, subluxation, or dislocation.  Soft tissues are intact.  No joint effusion.  IMPRESSION: Normal study.  Original Report Authenticated By: Cyndie Chime, M.D.     1. Knee injury       MDM  I personally performed the services described in this documentation, which was scribed in my presence. The recorded information has been reviewed and considered.        Nelia Shi, MD 06/21/12 [phone]

## 2012-06-16 NOTE — ED Notes (Signed)
Patient transported to X-ray 

## 2012-06-16 NOTE — ED Notes (Signed)
Pt reports her RT knee is swollen after she missed a step and fell 1 stair.Pt now reports increased swelling to RT knee.

## 2012-06-23 ENCOUNTER — Encounter: Payer: Self-pay | Admitting: Obstetrics & Gynecology

## 2012-06-23 ENCOUNTER — Ambulatory Visit (INDEPENDENT_AMBULATORY_CARE_PROVIDER_SITE_OTHER): Payer: Medicaid Other | Admitting: Obstetrics & Gynecology

## 2012-06-23 NOTE — Progress Notes (Unsigned)
Patient here for postpartum check, states has returned to work 2 weeks ago. C/o sprained right knee- notes edema in right knee.

## 2012-06-23 NOTE — Patient Instructions (Signed)
Etonogestrel implant What is this medicine? ETONOGESTREL is a contraceptive (birth control) device. It is used to prevent pregnancy. It can be used for up to 3 years. This medicine may be used for other purposes; ask your health care provider or pharmacist if you have questions. What should I tell my health care provider before I take this medicine? They need to know if you have any of these conditions: -abnormal vaginal bleeding -blood vessel disease or blood clots -cancer of the breast, cervix, or liver -depression -diabetes -gallbladder disease -headaches -heart disease or recent heart attack -high blood pressure -high cholesterol -kidney disease -liver disease -renal disease -seizures -tobacco smoker -an unusual or allergic reaction to etonogestrel, other hormones, anesthetics or antiseptics, medicines, foods, dyes, or preservatives -pregnant or trying to get pregnant -breast-feeding How should I use this medicine? This device is inserted just under the skin on the inner side of your upper arm by a health care professional. Talk to your pediatrician regarding the use of this medicine in children. Special care may be needed. Overdosage: If you think you've taken too much of this medicine contact a poison control center or emergency room at once. Overdosage: If you think you have taken too much of this medicine contact a poison control center or emergency room at once. NOTE: This medicine is only for you. Do not share this medicine with others. What if I miss a dose? This does not apply. What may interact with this medicine? Do not take this medicine with any of the following medications: -amprenavir -bosentan -fosamprenavir This medicine may also interact with the following medications: -barbiturate medicines for inducing sleep or treating seizures -certain medicines for fungal infections like ketoconazole and itraconazole -griseofulvin -medicines to treat seizures like  carbamazepine, felbamate, oxcarbazepine, phenytoin, topiramate -modafinil -phenylbutazone -rifampin -some medicines to treat HIV infection like atazanavir, indinavir, lopinavir, nelfinavir, tipranavir, ritonavir -St. John's wort This list may not describe all possible interactions. Give your health care provider a list of all the medicines, herbs, non-prescription drugs, or dietary supplements you use. Also tell them if you smoke, drink alcohol, or use illegal drugs. Some items may interact with your medicine. What should I watch for while using this medicine? This product does not protect you against HIV infection (AIDS) or other sexually transmitted diseases. You should be able to feel the implant by pressing your fingertips over the skin where it was inserted. Tell your doctor if you cannot feel the implant. What side effects may I notice from receiving this medicine? Side effects that you should report to your doctor or health care professional as soon as possible: -allergic reactions like skin rash, itching or hives, swelling of the face, lips, or tongue -breast lumps -changes in vision -confusion, trouble speaking or understanding -dark urine -depressed mood -general ill feeling or flu-like symptoms -light-colored stools -loss of appetite, nausea -right upper belly pain -severe headaches -severe pain, swelling, or tenderness in the abdomen -shortness of breath, chest pain, swelling in a leg -signs of pregnancy -sudden numbness or weakness of the face, arm or leg -trouble walking, dizziness, loss of balance or coordination -unusual vaginal bleeding, discharge -unusually weak or tired -yellowing of the eyes or skin Side effects that usually do not require medical attention (Report these to your doctor or health care professional if they continue or are bothersome.): -acne -breast pain -changes in weight -cough -fever or chills -headache -irregular menstrual  bleeding -itching, burning, and vaginal discharge -pain or difficulty passing urine -sore throat This   list may not describe all possible side effects. Call your doctor for medical advice about side effects. You may report side effects to FDA at 1-800-FDA-1088. Where should I keep my medicine? This drug is given in a hospital or clinic and will not be stored at home. NOTE: This sheet is a summary. It may not cover all possible information. If you have questions about this medicine, talk to your doctor, pharmacist, or health care provider.  2012, [employer]vier/Gold Standard. (08/10/2009 3:54:17 PM)Fibroids You have been diagnosed as having a fibroid. Fibroids are smooth muscle lumps (tumors) which can occur any place in a woman's body. They are usually in the womb (uterus). The most common problem (symptom) of fibroids is bleeding. Over time this may cause low red blood cells (anemia). Other symptoms include feelings of pressure and pain in the pelvis. The diagnosis (learning what is wrong) of fibroids is made by physical exam. Sometimes tests such as an ultrasound are used. This is helpful when fibroids are felt around the ovaries and to look for tumors. TREATMENT   Most fibroids do not need surgical or medical treatment. Sometimes a tissue sample (biopsy) of the lining of the uterus is done to rule out cancer. If there is no cancer and only a small amount of bleeding, the problem can be watched.   Hormonal treatment can improve the problem.   When surgery is needed, it can consist of removing the fibroid. Vaginal birth may not be possible after the removal of fibroids. This depends on where they are and the extent of surgery. When pregnancy occurs with fibroids it is usually normal.   Your caregiver can help decide which treatments are best for you.  HOME CARE INSTRUCTIONS   Do not use aspirin as this may increase bleeding problems.   If your periods (menses) are heavy, record the number of pads or  tampons used per month. Bring this information to your caregiver. This can help them determine the best treatment for you.  SEEK IMMEDIATE MEDICAL CARE IF:  You have pelvic pain or cramps not controlled with medications, or experience a sudden increase in pain.   You have an increase of pelvic bleeding between and during menses.   You feel lightheaded or have fainting spells.   You develop worsening belly (abdominal) pain.  Document Released: 11/14/2000 Document Revised: 11/06/2011 Document Reviewed: 07/06/2008 [facility] Patient Information 2012 [place], [place].

## 2012-06-23 NOTE — Progress Notes (Unsigned)
Patient ID: Michele Boyd, female   DOB: 02-14-1991, 21 y.o.   MRN: [id number] Pt for 6 week PP visit.  No complaints.  Reports being sexually active x 1 while on cycle. Want to schedule Nexplanon. H/o uterine fibroids.  NO tears or PP complications.  Pt with h/o dysmenorrhea and heavy cycles.  O/ Exam deferred as pt on cycle and declined exam   A/P 6 week PP check- no problems Contraception counseling- desires Nexplanon Uterine fibroids- d/w pt that Nexplanon may relieve sx.  Michele Boyd, M.D., [facility]

## 2012-06-25 ENCOUNTER — Ambulatory Visit (INDEPENDENT_AMBULATORY_CARE_PROVIDER_SITE_OTHER): Payer: Medicaid Other | Admitting: Obstetrics & Gynecology

## 2012-06-25 VITALS — BP 112/69 | HR 68 | Ht 62.0 in | Wt 192.3 lb

## 2012-06-25 DIAGNOSIS — Z30017 Encounter for initial prescription of implantable subdermal contraceptive: Secondary | ICD-10-CM

## 2012-06-25 MED ORDER — NAPROXEN 500 MG PO TABS: 500.0000 mg | ORAL_TABLET | Freq: Two times a day (BID) | ORAL | Status: DC

## 2012-06-25 MED ORDER — ETONOGESTREL 68 MG ~~LOC~~ IMPL
68.0000 mg | DRUG_IMPLANT | Freq: Once | SUBCUTANEOUS | Status: AC
  Administered 2012-06-25: 68 mg via SUBCUTANEOUS

## 2012-06-25 NOTE — Patient Instructions (Signed)

## 2012-06-25 NOTE — Addendum Note (Signed)
Addended by: Sherre Lain A on: 06/25/2012 11:20 AM   Modules accepted: Orders

## 2012-06-25 NOTE — Progress Notes (Signed)
Patient given informed consent, she signed consent form. Pregnancy test was negative.  Appropriate time out taken.  Patient's left arm was prepped and draped in the usual sterile fashion.. The ruler used to measure and mark insertion area.  Patient was prepped with alcohol swab and then injected with 5 ml of 1 % lidocaine.  She was prepped with betadine, Nexplanon removed from packaging,  Device confirmed in needle, then inserted full length of needle and withdrawn per handbook instructions.  There was minimal blood loss.  Patient insertion site covered with guaze and a pressure bandage to reduce any bruising.  The patient tolerated the procedure well and was given post procedure instructions. Return in about one month for Nexplanon check.   Pt also c/o pain in right knee.  H/o strain.  Prev seen by PCP in ER.  Told to f/u in 1 week.  Mortin not helpful.  Will prescribe Naproxen and have pt f/u as instructed with PCP.  Katelin Kutsch L. Harraway-Smith, M.D., [facility]

## 2013-05-26 ENCOUNTER — Emergency Department (HOSPITAL_COMMUNITY)
Admission: EM | Admit: 2013-05-26 | Discharge: 2013-05-26 | Disposition: A | Discharge status: Home / Self Care | Payer: Self-pay | Attending: Emergency Medicine | Admitting: Emergency Medicine

## 2013-05-26 ENCOUNTER — Encounter (HOSPITAL_COMMUNITY): Payer: Self-pay | Admitting: Adult Health

## 2013-05-26 DIAGNOSIS — R000 Tachycardia, unspecified: Secondary | ICD-10-CM | POA: Insufficient documentation

## 2013-05-26 DIAGNOSIS — R51 Headache: Secondary | ICD-10-CM | POA: Insufficient documentation

## 2013-05-26 DIAGNOSIS — H9209 Otalgia, unspecified ear: Secondary | ICD-10-CM | POA: Insufficient documentation

## 2013-05-26 DIAGNOSIS — B9689 Other specified bacterial agents as the cause of diseases classified elsewhere: Secondary | ICD-10-CM

## 2013-05-26 DIAGNOSIS — Z8742 Personal history of other diseases of the female genital tract: Secondary | ICD-10-CM | POA: Insufficient documentation

## 2013-05-26 DIAGNOSIS — J028 Acute pharyngitis due to other specified organisms: Secondary | ICD-10-CM

## 2013-05-26 DIAGNOSIS — F17200 Nicotine dependence, unspecified, uncomplicated: Secondary | ICD-10-CM | POA: Insufficient documentation

## 2013-05-26 DIAGNOSIS — R110 Nausea: Secondary | ICD-10-CM | POA: Insufficient documentation

## 2013-05-26 DIAGNOSIS — J029 Acute pharyngitis, unspecified: Secondary | ICD-10-CM | POA: Insufficient documentation

## 2013-05-26 DIAGNOSIS — R509 Fever, unspecified: Secondary | ICD-10-CM | POA: Insufficient documentation

## 2013-05-26 LAB — RAPID STREP SCREEN (MED CTR MEBANE ONLY): Streptococcus, Group A Screen (Direct): NEGATIVE

## 2013-05-26 MED ORDER — OXYCODONE-ACETAMINOPHEN 5-325 MG/5ML PO SOLN
5.0000 mL | ORAL | Status: DC | PRN
  Administered 2013-05-26: 5 mL via ORAL
  Filled 2013-05-26: qty 5

## 2013-05-26 MED ORDER — IBUPROFEN 100 MG/5ML PO SUSP
ORAL | Status: AC
  Administered 2013-05-26: 600 mg via ORAL
  Filled 2013-05-26: qty 30

## 2013-05-26 MED ORDER — PENICILLIN G BENZATHINE 1200000 UNIT/2ML IM SUSP
1.2000 10*6.[IU] | Freq: Once | INTRAMUSCULAR | Status: AC
  Administered 2013-05-26: 1.2 10*6.[IU] via INTRAMUSCULAR
  Filled 2013-05-26: qty 2

## 2013-05-26 MED ORDER — IBUPROFEN 100 MG/5ML PO SUSP
600.0000 mg | Freq: Once | ORAL | Status: AC
  Administered 2013-05-26 (×2): 600 mg via ORAL
  Filled 2013-05-26 (×2): qty 30

## 2013-05-26 MED ORDER — DEXAMETHASONE 10 MG/ML FOR PEDIATRIC ORAL USE
10.0000 mg | Freq: Once | INTRAMUSCULAR | Status: AC
  Administered 2013-05-26: 10 mg via ORAL
  Filled 2013-05-26: qty 1

## 2013-05-26 MED ORDER — CLINDAMYCIN HCL 150 MG PO CAPS: 450.0000 mg | ORAL_CAPSULE | Freq: Three times a day (TID) | ORAL | Status: DC

## 2013-05-26 NOTE — ED Provider Notes (Signed)
History    CSN: [id number] Arrival date & time 05/26/13  1916  First MD Initiated Contact with Patient 05/26/13 2141     Chief Complaint  Patient presents with  . Sore Throat   (Consider location/radiation/quality/duration/timing/severity/associated sxs/prior Treatment) Patient is a 22 y.o. female presenting with pharyngitis. The history is provided by the patient and medical records. No language interpreter was used.  Sore Throat This is a new problem. The current episode started in the past 7 days. The problem occurs constantly. The problem has been gradually worsening. Associated symptoms include a fever, headaches, nausea and a sore throat. Pertinent negatives include no abdominal pain, chest pain, chills, congestion, coughing, fatigue, neck pain, rash or vomiting. The symptoms are aggravated by eating and drinking. Treatments tried: salt water gargle. The treatment provided no relief.    Michele Boyd is a 21 y.o. female  with a no medical hx presents to the Emergency Department complaining of gradual, persistent, progressively worsening sore throat onset 2 days ago. Associated symptoms include painful swallowing, fever, headache, swollen lymph nodes, nausea.  Pt has attempted to gargle with salt water but nothing makes it better and eating and drinking makes it worse.  Pt denies chills, neck pain, chest pain, cough, abdominal pain, vomiting, diarrhea.  Pt's daughter was sick last week with a sore throat, but was not dx with strep throat.  Pt denies tonsillectomy.      Past Medical History  Diagnosis Date  . Fibroid    Past Surgical History  Procedure Laterality Date  . Cesarean section    . Wisdom tooth extraction     Family History  Problem Relation Age of Onset  . Anesthesia problems Neg Hx    History  Substance Use Topics  . Smoking status: Current Some Day Smoker    Types: Cigarettes  . Smokeless tobacco: Never Used  . Alcohol Use: No   OB History   Grav Para  Term Preterm Abortions TAB SAB Ect Mult Living   2 2 2       2      Review of Systems  Constitutional: Positive for fever. Negative for chills and fatigue.  HENT: Positive for ear pain and sore throat. Negative for congestion, facial swelling, rhinorrhea, drooling, mouth sores, trouble swallowing, neck pain, neck stiffness, dental problem, voice change and postnasal drip.   Eyes: Negative for pain.  Respiratory: Negative for cough, chest tightness and shortness of breath.   Cardiovascular: Negative for chest pain.  Gastrointestinal: Positive for nausea. Negative for vomiting and abdominal pain.  Skin: Negative for rash.  Neurological: Positive for headaches. Negative for facial asymmetry.  Hematological: Negative for adenopathy.  Psychiatric/Behavioral: The patient is not nervous/anxious.     Allergies  Review of patient's allergies indicates no known allergies.  Home Medications   Current Outpatient Rx  Name  Route  Sig  Dispense  Refill  . clindamycin (CLEOCIN) 150 MG capsule   Oral   Take 3 capsules (450 mg total) by mouth 3 (three) times daily.   90 capsule   0    BP 119/88  Pulse 100  Temp(Src) 100.4 F (38 C) (Oral)  Resp 22  SpO2 95% Physical Exam  Constitutional: She is oriented to person, place, and time. She appears well-developed and well-nourished. No distress.  HENT:  Head: Normocephalic and atraumatic.  Right Ear: Tympanic membrane, external ear and ear canal normal.  Left Ear: Tympanic membrane, external ear and ear canal normal.  Nose: No mucosal  edema or rhinorrhea. No epistaxis. Right sinus exhibits no maxillary sinus tenderness and no frontal sinus tenderness. Left sinus exhibits no maxillary sinus tenderness and no frontal sinus tenderness.  Mouth/Throat: Uvula is midline and mucous membranes are normal. Mucous membranes are not pale and not cyanotic. No edematous. Oropharyngeal exudate, posterior oropharyngeal edema and posterior oropharyngeal erythema  present. No tonsillar abscesses.  Eyes: Conjunctivae and EOM are normal. Pupils are equal, round, and reactive to light.  Neck: Normal range of motion and full passive range of motion without pain.  Cardiovascular: Regular rhythm, normal heart sounds and intact distal pulses.  Tachycardia present.   Pulses:      Radial pulses are 2+ on the right side, and 2+ on the left side.       Dorsalis pedis pulses are 2+ on the right side, and 2+ on the left side.       Posterior tibial pulses are 2+ on the right side, and 2+ on the left side.  Pulmonary/Chest: Effort normal and breath sounds normal. No accessory muscle usage or stridor. Not tachypneic. No respiratory distress. She has no decreased breath sounds. She has no wheezes. She has no rhonchi. She has no rales.  Abdominal: Soft. Bowel sounds are normal. She exhibits no distension. There is no tenderness.  Musculoskeletal: Normal range of motion. She exhibits no edema and no tenderness.  Lymphadenopathy:    She has no cervical adenopathy.  Neurological: She is alert and oriented to person, place, and time. She exhibits normal muscle tone. Coordination normal.  Skin: Skin is warm and dry. No rash noted. She is not diaphoretic. No erythema.  Psychiatric: She has a normal mood and affect. Her behavior is normal.    ED Course  Procedures (including critical care time) Labs Reviewed  RAPID STREP SCREEN  CULTURE, GROUP A STREP   No results found. 1. Bacterial pharyngitis     MDM  Michele Boyd presents with sore throat. Pt febrile with tonsillar exudate, cervical lymphadenopathy & dysphagia.  Rapid strep test negative; diagnosis of bacterial pharyngitis. Treated in the EdDwith steroids, NSAIDs, Pain medication and PCN IM.  Pt appears mildly dehydrated, discussed importance of water rehydration. Presentation non concerning for PTA or infxn spread to soft tissue. No trismus or uvula deviation. Specific return precautions discussed, including  return in 24 hours if symptoms progress. Pt able to drink water in ED without difficulty with intact air way. Recommended PCP follow up.  I have also d/c home with Rx for clindamycin as I am concerned about the potential for pt to be lost to f/u and progressing infection.  Instructed pt not to fill the Rx unless they are no better in 3 days. I have also discussed reasons to return immediately to the ER.  Patient expresses understanding and agrees with plan.     Dahlia Client Mikita Lesmeister, PA-C 05/26/13 2219

## 2013-05-26 NOTE — ED Notes (Signed)
Present with sore throat that began 2 days ago. Pt states, 'I think I have strep throat"

## 2013-05-27 ENCOUNTER — Telehealth (HOSPITAL_COMMUNITY): Payer: Self-pay | Admitting: Emergency Medicine

## 2013-05-28 LAB — CULTURE, GROUP A STREP

## 2013-05-30 NOTE — ED Provider Notes (Signed)
Medical screening examination/treatment/procedure(s) were performed by non-physician practitioner and as supervising physician I was immediately available for consultation/collaboration.    Christopher J. Pollina, MD 05/30/13 1537 

## 2013-10-20 ENCOUNTER — Encounter (HOSPITAL_COMMUNITY): Payer: Self-pay | Admitting: Emergency Medicine

## 2013-10-20 ENCOUNTER — Emergency Department (HOSPITAL_COMMUNITY)
Admission: EM | Admit: 2013-10-20 | Discharge: 2013-10-20 | Disposition: A | Discharge status: Home / Self Care | Payer: Medicaid Other | Attending: Emergency Medicine | Admitting: Emergency Medicine

## 2013-10-20 DIAGNOSIS — R197 Diarrhea, unspecified: Secondary | ICD-10-CM | POA: Insufficient documentation

## 2013-10-20 DIAGNOSIS — F17200 Nicotine dependence, unspecified, uncomplicated: Secondary | ICD-10-CM | POA: Insufficient documentation

## 2013-10-20 DIAGNOSIS — R109 Unspecified abdominal pain: Secondary | ICD-10-CM | POA: Insufficient documentation

## 2013-10-20 DIAGNOSIS — Z8742 Personal history of other diseases of the female genital tract: Secondary | ICD-10-CM | POA: Insufficient documentation

## 2013-10-20 DIAGNOSIS — Z3202 Encounter for pregnancy test, result negative: Secondary | ICD-10-CM | POA: Insufficient documentation

## 2013-10-20 DIAGNOSIS — R112 Nausea with vomiting, unspecified: Secondary | ICD-10-CM

## 2013-10-20 LAB — CBC WITH DIFFERENTIAL/PLATELET
Basophils Absolute: 0 10*3/uL (ref 0.0–0.1)
Basophils Relative: 0 % (ref 0–1)
MCHC: 34.5 g/dL (ref 30.0–36.0)
Monocytes Absolute: 0.4 10*3/uL (ref 0.1–1.0)
Neutro Abs: 2.3 10*3/uL (ref 1.7–7.7)
Neutrophils Relative %: 49 % (ref 43–77)
Platelets: 262 10*3/uL (ref 150–400)
RDW: 13.7 % (ref 11.5–15.5)

## 2013-10-20 LAB — COMPREHENSIVE METABOLIC PANEL
AST: 23 U/L (ref 0–37)
Albumin: 4.3 g/dL (ref 3.5–5.2)
Chloride: 104 mEq/L (ref 96–112)
Creatinine, Ser: 0.78 mg/dL (ref 0.50–1.10)
Potassium: 4 mEq/L (ref 3.5–5.1)
Total Bilirubin: 0.5 mg/dL (ref 0.3–1.2)

## 2013-10-20 LAB — POCT PREGNANCY, URINE: Preg Test, Ur: NEGATIVE

## 2013-10-20 MED ORDER — ONDANSETRON HCL 4 MG PO TABS: 4.0000 mg | ORAL_TABLET | Freq: Four times a day (QID) | ORAL | Status: DC

## 2013-10-20 MED ORDER — SODIUM CHLORIDE 0.9 % IV BOLUS (SEPSIS)
1000.0000 mL | Freq: Once | INTRAVENOUS | Status: AC
  Administered 2013-10-20: 1000 mL via INTRAVENOUS

## 2013-10-20 MED ORDER — ONDANSETRON HCL 4 MG/2ML IJ SOLN
4.0000 mg | Freq: Once | INTRAMUSCULAR | Status: AC
  Administered 2013-10-20: 4 mg via INTRAVENOUS
  Filled 2013-10-20: qty 2

## 2013-10-20 NOTE — ED Provider Notes (Signed)
CSN: [id number]     Arrival date & time 10/20/13  1108 History   First MD Initiated Contact with Patient 10/20/13 1127     Chief Complaint  Patient presents with  . Abdominal Pain  . Diarrhea   (Consider location/radiation/quality/duration/timing/severity/associated sxs/prior Treatment) HPI  22 year old female with past medical history of fibroid presents complaining of nausea vomiting and diarrhea. She states for the past 2 days she has been feeling nauseous and has had 2 bouts of nonbloody nonbilious vomit and about 4 or 5 bouts of nonbloody non-mucous diarrhea per day. She endorses a mild low abnormal cramping improving with drinking ginger ale and juice and was having bowel movement. She felt as if she has some stomach virus. She denies fever, chills, headache, runny nose, sneezing, cough, chest pain, shortness of breath, back pain, dysuria, hematuria, hematochezia, or melena. No recent travel, not eating any exotic food. She has an appetite. Patient has an implant on and does not recall her last menstrual period. No prior abdominal surgery. No recent antibiotic use. Patient feels dehydrated.   Past Medical History  Diagnosis Date  . Fibroid    Past Surgical History  Procedure Laterality Date  . Cesarean section    . Wisdom tooth extraction     Family History  Problem Relation Age of Onset  . Anesthesia problems Neg Hx    History  Substance Use Topics  . Smoking status: Current Some Day Smoker    Types: Cigarettes  . Smokeless tobacco: Never Used  . Alcohol Use: No   OB History   Grav Para Term Preterm Abortions TAB SAB Ect Mult Living   2 2 2       2      Review of Systems  All other systems reviewed and are negative.    Allergies  Review of patient's allergies indicates no known allergies.  Home Medications  No current outpatient prescriptions on file. BP 104/66  Pulse 78  Temp(Src) 98.3 F (36.8 C) (Oral)  Resp 18  Ht 5\' 7"  (1.702 m)  Wt 163 lb 6.4 oz  (74.118 kg)  BMI 25.59 kg/m2  SpO2 99% Physical Exam  Nursing note and vitals reviewed. Constitutional: She appears well-developed and well-nourished. No distress.  Awake, alert, nontoxic appearance  HENT:  Head: Atraumatic.  Mouth/Throat: Oropharynx is clear and moist.  Eyes: Conjunctivae are normal. Right eye exhibits no discharge. Left eye exhibits no discharge.  Neck: Neck supple.  Cardiovascular: Normal rate and regular rhythm.   Pulmonary/Chest: Effort normal. No respiratory distress. She exhibits no tenderness.  Abdominal: Soft. There is no tenderness. There is no rebound.  Musculoskeletal: She exhibits no tenderness.  ROM appears intact, no obvious focal weakness  Neurological:  Mental status and motor strength appears intact  Skin: No rash noted.  Psychiatric: She has a normal mood and affect.    ED Course  Procedures (including critical care time)  12:35 PM N/v/d, likely viral GI.  Is afebrile, VSS, abdomen soft and nontender.  Do not suspect biliary etiology or appendicitis.  Doubt UTI or colitis.  Work up initiated.    2:53 PM Labs are reassurring.  Pt felt better after receiving IVF, able to tolerates PO, stable for discharge.  Return precaution given.     Labs Review Labs Reviewed  CBC WITH DIFFERENTIAL - Abnormal; Notable for the following:    Hemoglobin 15.8 (*)    All other components within normal limits  COMPREHENSIVE METABOLIC PANEL  LIPASE, BLOOD  POCT PREGNANCY, URINE  Imaging Review No results found.  EKG Interpretation   None       MDM   1. Nausea vomiting and diarrhea    BP 105/63  Pulse 68  Temp(Src) 98.3 F (36.8 C) (Oral)  Resp 14  Ht 5\' 7"  (1.702 m)  Wt 163 lb 6.4 oz (74.118 kg)  BMI 25.59 kg/m2  SpO2 100%     Fayrene Helper, PA-C 10/20/13 1454

## 2013-10-20 NOTE — ED Notes (Addendum)
Per pt sts diarrhea x 2 days. sts lower abdominal pain. sts 4 x today. sts also some vomiting. Denies blood in stool or urinary symptoms.

## 2013-10-21 NOTE — ED Provider Notes (Signed)
Medical screening examination/treatment/procedure(s) were performed by non-physician practitioner and as supervising physician I was immediately available for consultation/collaboration.  EKG Interpretation   None         Suzi Roots, MD 10/21/13 1521

## 2013-12-02 ENCOUNTER — Encounter (HOSPITAL_COMMUNITY): Payer: Self-pay | Admitting: Emergency Medicine

## 2013-12-02 DIAGNOSIS — Z3202 Encounter for pregnancy test, result negative: Secondary | ICD-10-CM | POA: Insufficient documentation

## 2013-12-02 DIAGNOSIS — Z8742 Personal history of other diseases of the female genital tract: Secondary | ICD-10-CM | POA: Insufficient documentation

## 2013-12-02 DIAGNOSIS — S32009A Unspecified fracture of unspecified lumbar vertebra, initial encounter for closed fracture: Secondary | ICD-10-CM | POA: Insufficient documentation

## 2013-12-02 DIAGNOSIS — F17200 Nicotine dependence, unspecified, uncomplicated: Secondary | ICD-10-CM | POA: Insufficient documentation
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
## 2013-12-03 ENCOUNTER — Emergency Department (HOSPITAL_COMMUNITY): Payer: 59

## 2013-12-03 ENCOUNTER — Emergency Department (HOSPITAL_COMMUNITY)
Admission: EM | Admit: 2013-12-03 | Discharge: 2013-12-03 | Disposition: A | Discharge status: Home / Self Care | Payer: 59 | Attending: Emergency Medicine | Admitting: Emergency Medicine

## 2013-12-03 DIAGNOSIS — S32009A Unspecified fracture of unspecified lumbar vertebra, initial encounter for closed fracture: Secondary | ICD-10-CM

## 2013-12-03 LAB — URINALYSIS, ROUTINE W REFLEX MICROSCOPIC
Bilirubin Urine: NEGATIVE
Glucose, UA: NEGATIVE mg/dL
HGB URINE DIPSTICK: NEGATIVE
Ketones, ur: 15 mg/dL — AB
LEUKOCYTES UA: NEGATIVE
Nitrite: NEGATIVE
PROTEIN: NEGATIVE mg/dL
SPECIFIC GRAVITY, URINE: 1.033 — AB (ref 1.005–1.030)
UROBILINOGEN UA: 1 mg/dL (ref 0.0–1.0)
pH: 6 (ref 5.0–8.0)

## 2013-12-03 LAB — POCT PREGNANCY, URINE: PREG TEST UR: NEGATIVE

## 2013-12-03 MED ORDER — OXYCODONE-ACETAMINOPHEN 5-325 MG PO TABS: 1.0000 | ORAL_TABLET | Freq: Once | ORAL | Status: DC

## 2013-12-03 MED ORDER — DIAZEPAM 5 MG PO TABS: 5.0000 mg | ORAL_TABLET | Freq: Once | ORAL | Status: DC

## 2013-12-03 MED ORDER — HYDROCODONE-ACETAMINOPHEN 5-325 MG PO TABS: 1.0000 | ORAL_TABLET | ORAL | Status: DC | PRN

## 2013-12-03 MED ORDER — KETOROLAC TROMETHAMINE 60 MG/2ML IM SOLN: 60.0000 mg | Freq: Once | INTRAMUSCULAR | Status: DC

## 2013-12-03 MED ORDER — HYDROCODONE-ACETAMINOPHEN 5-325 MG PO TABS
1.0000 | ORAL_TABLET | Freq: Once | ORAL | Status: AC
  Administered 2013-12-03: 1 via ORAL
  Filled 2013-12-03: qty 1

## 2013-12-03 NOTE — ED Provider Notes (Signed)
Medical screening examination/treatment/procedure(s) were performed by non-physician practitioner and as supervising physician I was immediately available for consultation/collaboration.  EKG Interpretation   None         Wandra Arthurs, MD 12/03/13 [phone]
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
## 2013-12-03 NOTE — Discharge Instructions (Signed)
Take vicodin as prescribed for severe pain.  Do not drive within four hours of taking this medication (may cause drowsiness or confusion).   Take ibuprofen as well; up to 800mg  three times a day with food.  Continue to apply heat or ice.  Follow up with Dr. Ronnald Ramp.  Return to the ER if your pain worsens or you develop weakness/numbness of legs or loss of control of bladder/bowels.   Transverse Process Fracture A fracture of a bone is the same as a break in the bone. A fracture of a transverse process is a break of a part of one of the bones in the spine. This part extends out from the side of the main body of the bone (called the vertebral body). A transverse process is shaped like a wing. They extend from both the left and right sides of the vertebral body. Many of these injuries occur in the thoracic spine (the upper and middle parts of the back) and the lumbar spine (the low back area). In the elderly, these injuries can also occur in the lower neck area and are affected by age-related arthritis problems and osteoporosis (thinning of bone). It takes a lot of force to cause this type of fracture. Because other organs and so many other parts of the spine are close to the transverse processes, these fractures usually occur at the same time as injuries to:  Other bones.  Organs.  Possibly the spinal cord. Even if there is only a break to one transverse process, your caregiver will take steps to make sure that a nearby organ has not also been injured.  CAUSES  Most of these injuries occur as a result of a variety of accidents such as:  Falls.  Motor vehicle accidents.  Recreational activities.  A smaller number occur due to:  Industrial, Engineer, maintenance, and aviation accidents.  Gunshot wounds and direct blows to the back.  Parachuting incidents. SYMPTOMS  Patients with transverse process fractures have severe pain even if the actual break is small or limited and there is no injury to nearby  bones, organs, or the spinal cord. More severe or complex injuries involving other bones and/or organs may include:  Deformity of the back bones.  Swelling/bruising over the injured area.  Limited ability to move the affected area.  There may be injury to a nearby:  Lung.  Kidney.  Spleen.  Liver. Injury to nearby nerves can cause partial or complete loss of function of the bladder and/or bowels. More severe injuries can also cause:  Loss of sensation and/or strength in the arms/hands (if the break is in the lower part of the neck), legs, feet, and toes.  Spinal cord injury that leads to paralysis. DIAGNOSIS In most cases, a broken bone will be suspected by what happened just prior to the onset of back and/or neck pain. X-rays and special imaging (CT scan and MRI imaging) are used to confirm the diagnosis as well as finding out the type and severity of the break or breaks. These tests are important for guiding and planning treatment. There are times when special imaging cannot be done. MRI cannot be done if there is an implanted metallic device (such as a pacemaker). In these cases, other tests and imaging are done. Other tests may be done if your caregiver is concerned about injuries to internal organs near the break of the transverse process. For example, an ultrasound may be ordered to examine the liver, spleen, or kidneys. If there has been  nerve damage near the break, additional tests may be ordered in order to find out:  Exactly how much damage has occurred.  To plan for what can be done to help. These include:  Tests of nerve function through muscles (nerve conduction studies and electromyography).  Tests of bladder function (urodynamics).  Tests that focus on defining specific nerve problems before surgery and what improvement has come about after surgery (evoked potentials). TREATMENT If the injury is limited to a break of a transverse process with no other bone or  organ injury, hospital care may not be necessary. Medication for pain control, special back bracing, and limitations in activity are done first followed by physical therapy later. Complex breaks, multiple fractures of the spine, or unstable injuries can damage the spinal cord and may require an operation to remove pressure from the nerves and/or spinal cord and to stabilize the broken pieces of bone. Specialty care may be needed if there has been injury to an internal organ near a broken transverse process.Each individual set of injuries is unique, and your caregiver will review many things when planning the best approach that will give the highest likelihood of a good outcome.  HOME CARE INSTRUCTIONS There is pain and stiffness in the back for weeks after a transverse process fracture. Bed rest, pain medicine, and a slow return to activity are generally recommended. Neck and back braces may be helpful in reducing pain and increasing mobility. When your pain allows, simple walking will help to begin the process of returning to normal activities. Exercises to improve motion and to strengthen the back may also be useful after the initial pain goes away. This will be guided by your caregiver and the team (nurses, physical therapists, occupational therapists, etc.) involved with your ongoing care. For the elderly, treatment for osteoporosis may be essential to help reduce your risk of fractures in the future. Arrange for follow-up care as recommended to assure proper long-term care and prevention of further spine injury. It is important that you participate in your return to good health. The failure to follow up as recommended with your caregiver, orthopedic referral or other provider may result in the bone not healing properly with possible chronic disability or pain. SEEK MEDICAL CARE IF:  Pain is not effectively controlled with medication.  You feel unable to decrease pain medication over time as  planned.  Activity level is not improving as planned and/or expected. SEEK IMMEDIATE MEDICAL CARE IF:  You have increasing pain, vomiting, or are unable to move around at all.  You have numbness, tingling, weakness, or paralysis of any part of your body.  You have loss of normal bowel or bladder control.  You have difficulty breathing, cough, fever, chest or abdominal pain. MAKE SURE YOU:   Understand these instructions.  Will watch your condition.  Will get help right away if you are not doing well or get worse. Document Released: 03/04/2007 Document Revised: 02/09/2012 Document Reviewed: 11/02/2007 [facility] Patient Information 2014 [place], [place].

## 2014-07-21 ENCOUNTER — Inpatient Hospital Stay (HOSPITAL_COMMUNITY)
Admission: AD | Admit: 2014-07-21 | Discharge: 2014-07-21 | Disposition: A | Discharge status: Home / Self Care | Payer: Self-pay | Source: Ambulatory Visit | Attending: Obstetrics & Gynecology | Admitting: Obstetrics & Gynecology

## 2014-07-21 ENCOUNTER — Encounter (HOSPITAL_COMMUNITY): Payer: Self-pay | Admitting: General Practice

## 2014-07-21 DIAGNOSIS — F17200 Nicotine dependence, unspecified, uncomplicated: Secondary | ICD-10-CM | POA: Insufficient documentation

## 2014-07-21 DIAGNOSIS — B373 Candidiasis of vulva and vagina: Secondary | ICD-10-CM

## 2014-07-21 DIAGNOSIS — B3731 Acute candidiasis of vulva and vagina: Secondary | ICD-10-CM | POA: Insufficient documentation

## 2014-07-21 LAB — URINALYSIS, ROUTINE W REFLEX MICROSCOPIC
BILIRUBIN URINE: NEGATIVE
Glucose, UA: NEGATIVE mg/dL
KETONES UR: 15 mg/dL — AB
NITRITE: NEGATIVE
Protein, ur: NEGATIVE mg/dL
Specific Gravity, Urine: 1.02 (ref 1.005–1.030)
Urobilinogen, UA: 1 mg/dL (ref 0.0–1.0)
pH: 6.5 (ref 5.0–8.0)

## 2014-07-21 LAB — URINE MICROSCOPIC-ADD ON

## 2014-07-21 LAB — WET PREP, GENITAL: Trich, Wet Prep: NONE SEEN

## 2014-07-21 MED ORDER — FLUCONAZOLE 150 MG PO TABS: 150.0000 mg | ORAL_TABLET | Freq: Every day | ORAL | Status: DC

## 2014-07-21 NOTE — MAU Note (Signed)
Pt presents to MAU with c/o vaginal irritation for 2 days. States burning and itching with some light bleeding

## 2014-07-21 NOTE — Discharge Instructions (Signed)

## 2014-07-21 NOTE — MAU Provider Note (Signed)
CC: Vaginal Itching    First Provider Initiated Contact with Patient 07/21/14 1742      HPI Michele Boyd is a 23 y.o. [id number] who presents with onset of vulvar itching 2 days ago after using Dial soap. Got scared because she scratched so much it bled. Not bleeding now. Denies vaginal irritation or itch. Denies dysuria, frequency, urgency. Drinks a lot of Pepsi.  Amenorrheic on Nexplanon. Hx fibroids.   Past Medical History  Diagnosis Date  . Fibroid     OB History  Gravida Para Term Preterm AB SAB TAB Ectopic Multiple Living  2 2 2       2     # Outcome Date GA Lbr Len/2nd Weight Sex Delivery Anes PTL Lv  2 TRM 04/27/12 [redacted]w[redacted]d 15:50 / 00:37 2.495 kg (5 lb 8 oz) F VBAC EPI  Y     Comments: wnl  1 TRM 12/02/10     LTCS  N Y     Comments: breech      Past Surgical History  Procedure Laterality Date  . Cesarean section    . Wisdom tooth extraction      History   Social History  . Marital Status: Single    Spouse Name: N/A    Number of Children: N/A  . Years of Education: N/A   Occupational History  . Not on file.   Social History Main Topics  . Smoking status: Current Some Day Smoker    Types: Cigarettes  . Smokeless tobacco: Never Used  . Alcohol Use: No  . Drug Use: No  . Sexual Activity: Yes    Birth Control/ Protection: None   Other Topics Concern  . Not on file   Social History Narrative  . No narrative on file    No current facility-administered medications on file prior to encounter.   Current Outpatient Prescriptions on File Prior to Encounter  Medication Sig Dispense Refill  . HYDROcodone-acetaminophen (NORCO/VICODIN) 5-325 MG per tablet Take 1 tablet by mouth every 4 (four) hours as needed for moderate pain.  20 tablet  0    No Known Allergies  ROS Pertinent items in HPI  PHYSICAL EXAM Filed Vitals:   07/21/14 1721  BP: 109/74  Pulse: 91  Temp: 98.4 F (36.9 C)  Resp: 18   General: Well nourished, well developed female in no  acute distress Cardiovascular: Normal rate Respiratory: Normal effort Abdomen: Soft, nontender Back: No CVAT Extremities: No edema Neurologic: Alert and oriented Speculum exam: Perineum to forchette with slight erythematous, no edema, lesions or bleeding; vagina with thick white discharge, no blood; cervix clean Bimanual exam: cervix closed, no CMT; uterus NSSP; no adnexal tenderness or masses   LAB RESULTS Results for orders placed during the hospital encounter of 07/21/14 (from the past 24 hour(s))  URINALYSIS, ROUTINE W REFLEX MICROSCOPIC     Status: Abnormal   Collection Time    07/21/14  5:32 PM      Result Value Ref Range   Color, Urine YELLOW  YELLOW   APPearance CLEAR  CLEAR   Specific Gravity, Urine 1.020  1.005 - 1.030   pH 6.5  5.0 - 8.0   Glucose, UA NEGATIVE  NEGATIVE mg/dL   Hgb urine dipstick SMALL (*) NEGATIVE   Bilirubin Urine NEGATIVE  NEGATIVE   Ketones, ur 15 (*) NEGATIVE mg/dL   Protein, ur NEGATIVE  NEGATIVE mg/dL   Urobilinogen, UA 1.0  0.0 - 1.0 mg/dL   Nitrite NEGATIVE  NEGATIVE  Leukocytes, UA MODERATE (*) NEGATIVE  URINE MICROSCOPIC-ADD ON     Status: Abnormal   Collection Time    07/21/14  5:32 PM      Result Value Ref Range   Squamous Epithelial / LPF FEW (*) RARE   WBC, UA 7-10  <3 WBC/hpf   RBC / HPF 0-2  <3 RBC/hpf   Urine-Other MUCOUS PRESENT    WET PREP, GENITAL     Status: Abnormal   Collection Time    07/21/14  6:15 PM      Result Value Ref Range   Yeast Wet Prep HPF POC FEW (*) NONE SEEN   Trich, Wet Prep NONE SEEN  NONE SEEN   Clue Cells Wet Prep HPF POC FEW (*) NONE SEEN   WBC, Wet Prep HPF POC FEW (*) NONE SEEN    IMAGING No results found.  MAU COURSE GC/CT sent  ASSESSMENT  1. Yeast infection involving the vagina and surrounding area     PLAN Discharge home. See AVS for patient education. Increase fluids. Stop soft drinks. Stop Dial soap to genital area.    Medication List         fluconazole 150 MG tablet   Commonly known as:  DIFLUCAN  Take 1 tablet (150 mg total) by mouth daily.     NEXPLANON [place]  Inject 1 each into the skin once.       Follow-up Information   Schedule an appointment as soon as possible for a visit with [facility] [place]. (If symptoms worsen)    Contact information:   [place] [address] [phone]       Rechy Bost C Christiana Gurevich, CNM 07/21/2014 5:47 PM

## 2014-07-21 NOTE — MAU Note (Signed)
Urine in lab 

## 2014-07-22 LAB — GC/CHLAMYDIA PROBE AMP
CT Probe RNA: NEGATIVE
GC PROBE AMP APTIMA: NEGATIVE

## 2014-07-22 NOTE — MAU Provider Note (Signed)

## 2014-10-02 ENCOUNTER — Encounter (HOSPITAL_COMMUNITY): Payer: Self-pay | Admitting: General Practice

## 2015-02-10 ENCOUNTER — Encounter (HOSPITAL_COMMUNITY): Payer: Self-pay | Admitting: Emergency Medicine

## 2015-02-10 ENCOUNTER — Emergency Department (HOSPITAL_COMMUNITY): Payer: Self-pay

## 2015-02-10 ENCOUNTER — Emergency Department (HOSPITAL_COMMUNITY)
Admission: EM | Admit: 2015-02-10 | Discharge: 2015-02-10 | Disposition: A | Discharge status: Home / Self Care | Payer: Self-pay | Attending: Emergency Medicine | Admitting: Emergency Medicine

## 2015-02-10 DIAGNOSIS — S0990XA Unspecified injury of head, initial encounter: Secondary | ICD-10-CM | POA: Insufficient documentation

## 2015-02-10 DIAGNOSIS — R55 Syncope and collapse: Secondary | ICD-10-CM | POA: Insufficient documentation

## 2015-02-10 DIAGNOSIS — S022XXA Fracture of nasal bones, initial encounter for closed fracture: Secondary | ICD-10-CM

## 2015-02-10 DIAGNOSIS — W109XXA Fall (on) (from) unspecified stairs and steps, initial encounter: Secondary | ICD-10-CM | POA: Insufficient documentation

## 2015-02-10 DIAGNOSIS — Y939 Activity, unspecified: Secondary | ICD-10-CM | POA: Insufficient documentation

## 2015-02-10 DIAGNOSIS — Y929 Unspecified place or not applicable: Secondary | ICD-10-CM | POA: Insufficient documentation

## 2015-02-10 DIAGNOSIS — Z720 Tobacco use: Secondary | ICD-10-CM | POA: Insufficient documentation

## 2015-02-10 DIAGNOSIS — Z8742 Personal history of other diseases of the female genital tract: Secondary | ICD-10-CM | POA: Insufficient documentation

## 2015-02-10 DIAGNOSIS — Y999 Unspecified external cause status: Secondary | ICD-10-CM | POA: Insufficient documentation

## 2015-02-10 DIAGNOSIS — J069 Acute upper respiratory infection, unspecified: Secondary | ICD-10-CM

## 2015-02-10 MED ORDER — ACETAMINOPHEN 325 MG PO TABS
650.0000 mg | ORAL_TABLET | Freq: Once | ORAL | Status: AC
  Administered 2015-02-10: 650 mg via ORAL
  Filled 2015-02-10: qty 2

## 2015-02-10 MED ORDER — PHENYLEPHRINE-DM-GG-APAP 5-10-200-325 MG PO CAPS: 2.0000 | ORAL_CAPSULE | Freq: Three times a day (TID) | ORAL | Status: DC

## 2015-02-10 MED ORDER — HYDROCODONE-ACETAMINOPHEN 5-325 MG PO TABS: 1.0000 | ORAL_TABLET | ORAL | Status: DC | PRN

## 2015-02-10 MED ORDER — ALBUTEROL SULFATE HFA 108 (90 BASE) MCG/ACT IN AERS: 2.0000 | INHALATION_SPRAY | RESPIRATORY_TRACT | Status: DC | PRN

## 2015-02-10 NOTE — ED Notes (Addendum)
Pt st's she has had cold symptoms x's 2 weeks.  C/o sore throat and runny nose, headache and body aches

## 2015-02-10 NOTE — Discharge Instructions (Signed)
Please follow the directions provided.  Be sure to follow-up with the Ear, Nose and Throat Doctor later this week to evaluate your nose.  You may use ice and ibuprofen for pain.  For your upper respiratory infection, you may use the albuterol inhaler, 2 puffs every 4 hours for cough or shortness of breath.  Please take the multi-symptom cold medicine three times a day to help with your symptoms. Don't hesitate to return for any new, worsening or concerning symptoms.      SEEK IMMEDIATE MEDICAL CARE IF:  You have a fever.  You develop severe or persistent headache, ear pain, sinus pain, or chest pain.  You develop wheezing, a prolonged cough, cough up blood, or have a change in your usual mucus (if you have chronic lung disease).  You develop sore muscles or a stiff neck.

## 2015-02-10 NOTE — ED Provider Notes (Signed)
CSN: [id number]     Arrival date & time 02/10/15  0121 History   First MD Initiated Contact with Patient 02/10/15 0254     Chief Complaint  Patient presents with  . URI   (Consider location/radiation/quality/duration/timing/severity/associated sxs/prior Treatment) HPI  Michele Boyd is a 24 year old female presenting with report of fall 3 nights ago. She states she was out with friends and had been drinking a lot and does not recall the fall. She states the following day she noticed that she had a laceration to the bridge of her nose and bilateral bruising under her eyes. Rates her friends told her that she fell down some stairs landing on her face. The following day she had blood coming from the nasal laceration and from both nostrils. The bleeding is controlled but she is concerned about the bruising under her eyes. She has tenderness under her eyes and across the bridge of her nose. She also reports a continued cold with nasal congestion, cough, generalized body aches 2 weeks. He reports sometimes a productive cough of yellow phlegm. She denies any visual changes, focal deficit, nausea, vomiting, fevers or abdominal pain.  Past Medical History  Diagnosis Date  . Fibroid    Past Surgical History  Procedure Laterality Date  . Cesarean section    . Wisdom tooth extraction     Family History  Problem Relation Age of Onset  . Anesthesia problems Neg Hx    History  Substance Use Topics  . Smoking status: Current Some Day Smoker    Types: Cigarettes  . Smokeless tobacco: Never Used  . Alcohol Use: No   OB History    Gravida Para Term Preterm AB TAB SAB Ectopic Multiple Living   2 2 2       2      Review of Systems  Constitutional: Negative for fever and chills.  HENT: Positive for congestion, facial swelling, rhinorrhea and sore throat.   Eyes: Negative for visual disturbance.  Respiratory: Negative for cough and shortness of breath.   Cardiovascular: Negative for chest pain  and leg swelling.  Gastrointestinal: Negative for nausea, vomiting and diarrhea.  Genitourinary: Negative for dysuria.  Musculoskeletal: Negative for myalgias.  Skin: Negative for rash.  Neurological: Positive for syncope and headaches. Negative for weakness and numbness.    Allergies  Review of patient's allergies indicates no known allergies.  Home Medications   Prior to Admission medications   Medication Sig Start Date End Date Taking? Authorizing Provider  diphenhydrAMINE (BENADRYL) 25 mg capsule Take 25 mg by mouth every 6 (six) hours as needed for allergies.   Yes Historical Provider, MD  Etonogestrel (NEXPLANON [place]) Inject 1 each into the skin once.   Yes Historical Provider, MD  loratadine (CLARITIN) 10 MG tablet Take 10 mg by mouth daily as needed for allergies.   Yes Historical Provider, MD  fluconazole (DIFLUCAN) 150 MG tablet Take 1 tablet (150 mg total) by mouth daily. Patient not taking: Reported on 02/10/2015 07/21/14   Deirdre C Poe, CNM   BP 114/72 mmHg  Pulse 87  Temp(Src) 98.5 F (36.9 C) (Oral)  Resp 18  SpO2 100% Physical Exam  Constitutional: She is oriented to person, place, and time. She appears well-developed and well-nourished. No distress.  HENT:  Head: Normocephalic. Head is with raccoon's eyes. Head is without Battle's sign.  Nose: Rhinorrhea and nasal deformity present.    Mouth/Throat: Oropharynx is clear and moist. No oropharyngeal exudate.  Healing laceration and swelling to bridge  of nose  Eyes: Conjunctivae and EOM are normal. Pupils are equal, round, and reactive to light.  Neck: Neck supple.  Cardiovascular: Normal rate, regular rhythm and intact distal pulses.   Pulmonary/Chest: Effort normal and breath sounds normal. No respiratory distress. She has no wheezes. She has no rales. She exhibits no tenderness.  Abdominal: Soft. There is no tenderness.  Musculoskeletal: She exhibits no tenderness.  Lymphadenopathy:    She has no cervical  adenopathy.  Neurological: She is alert and oriented to person, place, and time. She has normal strength. No cranial nerve deficit or sensory deficit. She exhibits normal muscle tone. Coordination normal. GCS eye subscore is 4. GCS verbal subscore is 5. GCS motor subscore is 6.  Cranial nerves 2-12 intact  Skin: Skin is warm and dry. No rash noted. She is not diaphoretic.  Psychiatric: She has a normal mood and affect.  Nursing note and vitals reviewed.   ED Course  Procedures (including critical care time) Labs Review Labs Reviewed - No data to display  Imaging Review Ct Head Wo Contrast  02/10/2015   CLINICAL DATA:  Cold symptoms for 2 weeks, sore throat and runny nose, headache. Possible fall downstairs 5 days ago. Facial laceration.  EXAM: CT HEAD WITHOUT CONTRAST  CT MAXILLOFACIAL WITHOUT CONTRAST  TECHNIQUE: Multidetector CT imaging of the head and maxillofacial structures were performed using the standard protocol without intravenous contrast. Multiplanar CT image reconstructions of the maxillofacial structures were also generated.  COMPARISON:  None.  FINDINGS: CT HEAD FINDINGS  The ventricles and sulci are normal. No intraparenchymal hemorrhage, mass effect nor midline shift. No acute large vascular territory infarcts.  No abnormal extra-axial fluid collections. Motion degraded examination resultant spurious extra-axial blood products bilaterally, conforming to linear morphology consistent with artifact. Basal cisterns are patent. No skull fracture.  CT MAXILLOFACIAL FINDINGS  The mandible is intact, the condyles are located. Comminuted depressed nasal bone fractures, slightly displaced to the RIGHT. Minimally displaced osseous nasal septum fracture. Fracture extends into the nasal process of the RIGHT maxilla, sparing the nasofrontal suture.  Moderate paranasal sinus mucosal thickening with small RIGHT sphenoid air air-fluid level. The mastoid air cells are well aerated. No destructive bony  lesions.  Midface soft tissue swelling without subcutaneous gas or radiopaque foreign bodies.  IMPRESSION: CT HEAD: No acute intracranial process on this mildly motion degraded examination.  CT MAXILLOFACIAL: Bilaterally mildly displaced nasal bone and osseous nasal septal fractures.  Moderate acute on chronic paranasal sinusitis.   Electronically Signed   By: Elon Alas   On: 02/10/2015 04:42   Ct Maxillofacial Wo Cm  02/10/2015   CLINICAL DATA:  Cold symptoms for 2 weeks, sore throat and runny nose, headache. Possible fall downstairs 5 days ago. Facial laceration.  EXAM: CT HEAD WITHOUT CONTRAST  CT MAXILLOFACIAL WITHOUT CONTRAST  TECHNIQUE: Multidetector CT imaging of the head and maxillofacial structures were performed using the standard protocol without intravenous contrast. Multiplanar CT image reconstructions of the maxillofacial structures were also generated.  COMPARISON:  None.  FINDINGS: CT HEAD FINDINGS  The ventricles and sulci are normal. No intraparenchymal hemorrhage, mass effect nor midline shift. No acute large vascular territory infarcts.  No abnormal extra-axial fluid collections. Motion degraded examination resultant spurious extra-axial blood products bilaterally, conforming to linear morphology consistent with artifact. Basal cisterns are patent. No skull fracture.  CT MAXILLOFACIAL FINDINGS  The mandible is intact, the condyles are located. Comminuted depressed nasal bone fractures, slightly displaced to the RIGHT. Minimally displaced osseous nasal  septum fracture. Fracture extends into the nasal process of the RIGHT maxilla, sparing the nasofrontal suture.  Moderate paranasal sinus mucosal thickening with small RIGHT sphenoid air air-fluid level. The mastoid air cells are well aerated. No destructive bony lesions.  Midface soft tissue swelling without subcutaneous gas or radiopaque foreign bodies.  IMPRESSION: CT HEAD: No acute intracranial process on this mildly motion degraded  examination.  CT MAXILLOFACIAL: Bilaterally mildly displaced nasal bone and osseous nasal septal fractures.  Moderate acute on chronic paranasal sinusitis.   Electronically Signed   By: Elon Alas   On: 02/10/2015 04:42     EKG Interpretation None      MDM   Final diagnoses:  Nasal fracture, closed, initial encounter  URI (upper respiratory infection)   24 yo presenting with symptoms consistent with URI but also facial injury after fall 3 days ago. Head CT and CT maxillofacial done due to unknown LOC and bilat  infraorbital bruising.  Imaging reviewed and shows nasal fracture but no other acute abnormality. Referral provided for ENT follow-up. Discussed symptomatic mgmt for URI symptoms. Pt is well-appearing, in no acute distress and vital signs reviewed and not concerning. Discussed increased PO intake of non-alcoholic fluids for re-hydration. She appears safe to be discharged.  Return precautions provided. Pt aware of plan and in agreement.     Filed Vitals:   02/10/15 0345 02/10/15 0512 02/10/15 0515 02/10/15 0541  BP: 97/52 96/69 100/52 99/64  Pulse: 61 83 68 68  Temp:    97.9 F (36.6 C)  TempSrc:    Oral  Resp:  17    SpO2: 100% 100% 100% 100%   Meds given in ED:  Medications  acetaminophen (TYLENOL) tablet 650 mg (650 mg Oral Given 02/10/15 0326)    Discharge Medication List as of 02/10/2015  5:29 AM    START taking these medications   Details  albuterol (PROVENTIL HFA;VENTOLIN HFA) 108 (90 BASE) MCG/ACT inhaler Inhale 2 puffs into the lungs every 4 (four) hours as needed for wheezing or shortness of breath., Starting 02/10/2015, Until Discontinued, Print    HYDROcodone-acetaminophen (NORCO/VICODIN) 5-325 MG per tablet Take 1 tablet by mouth every 4 (four) hours as needed., Starting 02/10/2015, Until Discontinued, Print    Phenylephrine-DM-GG-APAP (MUCINEX FAST-MAX) 5-10-200-325 MG CAPS Take 2 capsules by mouth 3 (three) times daily., Starting 02/10/2015, Until  Discontinued, Print           Britt Bottom, NP 02/10/15 1436  Wandra Arthurs, MD 02/12/15 1012

## 2016-09-20 ENCOUNTER — Encounter (HOSPITAL_COMMUNITY): Payer: Self-pay | Admitting: *Deleted

## 2016-09-20 ENCOUNTER — Inpatient Hospital Stay (HOSPITAL_COMMUNITY)
Admission: AD | Admit: 2016-09-20 | Discharge: 2016-09-20 | Disposition: A | Discharge status: Home / Self Care | Payer: Self-pay | Source: Ambulatory Visit | Attending: Obstetrics & Gynecology | Admitting: Obstetrics & Gynecology

## 2016-09-20 DIAGNOSIS — N762 Acute vulvitis: Secondary | ICD-10-CM | POA: Insufficient documentation

## 2016-09-20 DIAGNOSIS — N898 Other specified noninflammatory disorders of vagina: Secondary | ICD-10-CM

## 2016-09-20 DIAGNOSIS — R102 Pelvic and perineal pain: Secondary | ICD-10-CM | POA: Insufficient documentation

## 2016-09-20 DIAGNOSIS — Z3202 Encounter for pregnancy test, result negative: Secondary | ICD-10-CM | POA: Insufficient documentation

## 2016-09-20 DIAGNOSIS — F17210 Nicotine dependence, cigarettes, uncomplicated: Secondary | ICD-10-CM | POA: Insufficient documentation

## 2016-09-20 LAB — WET PREP, GENITAL
SPERM: NONE SEEN
Trich, Wet Prep: NONE SEEN
YEAST WET PREP: NONE SEEN

## 2016-09-20 LAB — URINE MICROSCOPIC-ADD ON

## 2016-09-20 LAB — URINALYSIS, ROUTINE W REFLEX MICROSCOPIC
Bilirubin Urine: NEGATIVE
GLUCOSE, UA: NEGATIVE mg/dL
Ketones, ur: NEGATIVE mg/dL
Leukocytes, UA: NEGATIVE
Nitrite: NEGATIVE
Protein, ur: NEGATIVE mg/dL
Specific Gravity, Urine: <1.005 — ABNORMAL LOW (ref 1.005–1.030)
pH: 6 (ref 5.0–8.0)

## 2016-09-20 LAB — POCT PREGNANCY, URINE: PREG TEST UR: NEGATIVE

## 2016-09-20 MED ORDER — OXYCODONE-ACETAMINOPHEN 5-325 MG PO TABS
2.0000 | ORAL_TABLET | Freq: Once | ORAL | Status: AC
  Administered 2016-09-20: 2 via ORAL
  Filled 2016-09-20: qty 2

## 2016-09-20 MED ORDER — LORAZEPAM 1 MG PO TABS
1.0000 mg | ORAL_TABLET | Freq: Once | ORAL | Status: AC
  Administered 2016-09-20: 1 mg via ORAL
  Filled 2016-09-20: qty 1

## 2016-09-20 MED ORDER — OXYCODONE-ACETAMINOPHEN 5-325 MG PO TABS: 1.0000 | ORAL_TABLET | ORAL | 0 refills | Status: DC | PRN

## 2016-09-20 MED ORDER — AMOXICILLIN-POT CLAVULANATE 875-125 MG PO TABS: 1.0000 | ORAL_TABLET | Freq: Two times a day (BID) | ORAL | 0 refills | Status: DC

## 2016-09-20 MED ORDER — ACYCLOVIR 400 MG PO TABS: 400.0000 mg | ORAL_TABLET | Freq: Three times a day (TID) | ORAL | 0 refills | Status: AC

## 2016-09-20 NOTE — MAU Provider Note (Signed)
History     CSN: [id number]  Arrival date and time: 09/20/16 2039   First Provider Initiated Contact with Patient 09/20/16 2126      Chief Complaint  Patient presents with  . Vaginal Pain   HPI   [name] is 25 y.o. female G2P2002 here in MAU with vaginal pain. Symptoms have been present for more than 1 week however have worsened in the last 2 days. It has become difficult to sleep and sit due to the pain. She has never had this pain before. She feels her vaginal area is swollen. She had intercourse in the last few days and that made the pain worse. She has no new sexual partners.   OB History    Gravida Para Term Preterm AB Living   2 2 2     2    SAB TAB Ectopic Multiple Live Births           2      Past Medical History:  Diagnosis Date  . Fibroid     Past Surgical History:  Procedure Laterality Date  . CESAREAN SECTION    . WISDOM TOOTH EXTRACTION      Family History  Problem Relation Age of Onset  . Anesthesia problems Neg Hx     Social History  Substance Use Topics  . Smoking status: Current Some Day Smoker    Types: Cigarettes  . Smokeless tobacco: Never Used  . Alcohol use No    Allergies: No Known Allergies  Prescriptions Prior to Admission  Medication Sig Dispense Refill Last Dose  . albuterol (PROVENTIL HFA;VENTOLIN HFA) 108 (90 BASE) MCG/ACT inhaler Inhale 2 puffs into the lungs every 4 (four) hours as needed for wheezing or shortness of breath. 1 Inhaler 3   . diphenhydrAMINE (BENADRYL) 25 mg capsule Take 25 mg by mouth every 6 (six) hours as needed for allergies.   02/09/2015 at Unknown time  . Etonogestrel (NEXPLANON [place]) Inject 1 each into the skin once.   02/10/2015 at Unknown time  . fluconazole (DIFLUCAN) 150 MG tablet Take 1 tablet (150 mg total) by mouth daily. (Patient not taking: Reported on 02/10/2015) 1 tablet 1 Not Taking at Unknown time  . HYDROcodone-acetaminophen (NORCO/VICODIN) 5-325 MG per tablet Take 1 tablet by mouth  every 4 (four) hours as needed. 6 tablet 0   . loratadine (CLARITIN) 10 MG tablet Take 10 mg by mouth daily as needed for allergies.   02/09/2015 at Unknown time  . Phenylephrine-DM-GG-APAP (MUCINEX FAST-MAX) 5-10-200-325 MG CAPS Take 2 capsules by mouth 3 (three) times daily. 60 capsule 0    Results for orders placed or performed during the hospital encounter of 09/20/16 (from the past 72 hour(s))  Urinalysis, Routine w reflex microscopic (not at [facility])     Status: Abnormal   Collection Time: 09/20/16  8:45 PM  Result Value Ref Range   Color, Urine YELLOW YELLOW   APPearance CLEAR CLEAR   Specific Gravity, Urine <1.005 (L) 1.005 - 1.030   pH 6.0 5.0 - 8.0   Glucose, UA NEGATIVE NEGATIVE mg/dL   Hgb urine dipstick LARGE (A) NEGATIVE   Bilirubin Urine NEGATIVE NEGATIVE   Ketones, ur NEGATIVE NEGATIVE mg/dL   Protein, ur NEGATIVE NEGATIVE mg/dL   Nitrite NEGATIVE NEGATIVE   Leukocytes, UA NEGATIVE NEGATIVE  Urine microscopic-add on     Status: Abnormal   Collection Time: 09/20/16  8:45 PM  Result Value Ref Range   Squamous Epithelial / LPF 0-5 (A) NONE SEEN  WBC, UA 0-5 0 - 5 WBC/hpf   RBC / HPF 6-30 0 - 5 RBC/hpf   Bacteria, UA FEW (A) NONE SEEN  Pregnancy, urine POC     Status: None   Collection Time: 09/20/16  9:13 PM  Result Value Ref Range   Preg Test, Ur NEGATIVE NEGATIVE    Comment:        THE SENSITIVITY OF THIS METHODOLOGY IS >24 mIU/mL   Wet prep, genital     Status: Abnormal   Collection Time: 09/20/16 10:32 PM  Result Value Ref Range   Yeast Wet Prep HPF POC NONE SEEN NONE SEEN   Trich, Wet Prep NONE SEEN NONE SEEN   Clue Cells Wet Prep HPF POC PRESENT (A) NONE SEEN   WBC, Wet Prep HPF POC FEW (A) NONE SEEN    Comment: MANY BACTERIA SEEN   Sperm NONE SEEN     Review of Systems  Constitutional: Negative for chills and fever.  Gastrointestinal: Negative for nausea and vomiting.   Physical Exam   Blood pressure 128/83, pulse 95, temperature 99.1 F (37.3 C),  temperature source Oral, resp. rate 16, weight 190 lb 1.9 oz (86.2 kg), last menstrual period 08/21/2016, SpO2 100 %.  Physical Exam  Genitourinary:    There is tenderness and lesion on the right labia. There is erythema and bleeding in the vagina.  Genitourinary Comments: Significant edema/erythema noted on right labia. + tenderness. Few ?herpetic lesions noted. HSV culture collected.     MAU Course  Procedures  None  MDM  Urine pregnancy test negative UA Difficult to examine patient due to patient's discomfort.  Percocet 2 tabs PO Per Dr. Harolyn Rutherford- will given PO ativan 1 mg  Patient currently rates her pain 0/10 Dr. Harolyn Rutherford called to examine patient.   Assessment and Plan   A:  1. Acute vulvitis   2. Vaginal lesion     P:  Discharge home in stable condition HSV culture collected & pending Rx: Acyclovir, Bactrim, percocet  Return to MAU with fever, or worsening symptoms    Lezlie Lye, NP 09/22/2016 9:44 AM

## 2016-09-20 NOTE — MAU Note (Signed)
Patient presents to mau with c/o vaginal pain. States that she has had her cycle for a month with bleeding off and on. Reporting that she feels a "lump" at the vaginal opening that has became swollen following intercourse. States it is uncomfortable to sit down. Has not taken anything for pain.

## 2016-09-21 ENCOUNTER — Other Ambulatory Visit: Payer: Self-pay | Admitting: Obstetrics and Gynecology

## 2016-09-21 ENCOUNTER — Telehealth: Payer: Self-pay | Admitting: Obstetrics and Gynecology

## 2016-09-21 MED ORDER — AMOXICILLIN-POT CLAVULANATE 500-125 MG PO TABS: 1.0000 | ORAL_TABLET | Freq: Three times a day (TID) | ORAL | 0 refills | Status: DC

## 2016-09-21 NOTE — Telephone Encounter (Signed)
As per request of the patient, Augmentin prescription is converted to Augmentin 500/125 and is called into [place] of patient choice, location unknown, at [phone] and will be available tomorrow morning

## 2016-09-22 LAB — GC/CHLAMYDIA PROBE AMP (~~LOC~~) NOT AT ARMC
Chlamydia: NEGATIVE
Neisseria Gonorrhea: NEGATIVE

## 2016-09-23 LAB — HSV CULTURE AND TYPING

## 2016-10-20 ENCOUNTER — Telehealth: Payer: Self-pay | Admitting: Obstetrics and Gynecology

## 2016-10-20 NOTE — Telephone Encounter (Signed)
Patient notified of + HSV culture. All questions answered

## 2016-12-25 ENCOUNTER — Emergency Department (HOSPITAL_COMMUNITY)
Admission: EM | Admit: 2016-12-25 | Discharge: 2016-12-25 | Disposition: A | Discharge status: Home / Self Care | Payer: Self-pay | Attending: Emergency Medicine | Admitting: Emergency Medicine

## 2016-12-25 ENCOUNTER — Encounter (HOSPITAL_COMMUNITY): Payer: Self-pay | Admitting: Emergency Medicine

## 2016-12-25 DIAGNOSIS — Z5321 Procedure and treatment not carried out due to patient leaving prior to being seen by health care provider: Secondary | ICD-10-CM | POA: Insufficient documentation

## 2016-12-25 DIAGNOSIS — R1110 Vomiting, unspecified: Secondary | ICD-10-CM | POA: Insufficient documentation

## 2016-12-25 LAB — COMPREHENSIVE METABOLIC PANEL
ALBUMIN: 3.8 g/dL (ref 3.5–5.0)
ALK PHOS: 54 U/L (ref 38–126)
ALT: 19 U/L (ref 14–54)
ANION GAP: 5 (ref 5–15)
AST: 18 U/L (ref 15–41)
BILIRUBIN TOTAL: 0.7 mg/dL (ref 0.3–1.2)
BUN: 11 mg/dL (ref 6–20)
CALCIUM: 8.6 mg/dL — AB (ref 8.9–10.3)
CO2: 27 mmol/L (ref 22–32)
Chloride: 106 mmol/L (ref 101–111)
Creatinine, Ser: 0.82 mg/dL (ref 0.44–1.00)
GFR calc Af Amer: >60 mL/min (ref >60)
GFR calc non Af Amer: >60 mL/min (ref >60)
GLUCOSE: 83 mg/dL (ref 65–99)
Potassium: 3.5 mmol/L (ref 3.5–5.1)
SODIUM: 138 mmol/L (ref 135–145)
Total Protein: 6.5 g/dL (ref 6.5–8.1)

## 2016-12-25 LAB — URINALYSIS, ROUTINE W REFLEX MICROSCOPIC
Bilirubin Urine: NEGATIVE
GLUCOSE, UA: NEGATIVE mg/dL
Ketones, ur: NEGATIVE mg/dL
Leukocytes, UA: NEGATIVE
Nitrite: NEGATIVE
PROTEIN: NEGATIVE mg/dL
SPECIFIC GRAVITY, URINE: 1.017 (ref 1.005–1.030)
pH: 5 (ref 5.0–8.0)

## 2016-12-25 LAB — CBC
HCT: 42.8 % (ref 36.0–46.0)
HEMOGLOBIN: 14.6 g/dL (ref 12.0–15.0)
MCH: 32.1 pg (ref 26.0–34.0)
MCHC: 34.1 g/dL (ref 30.0–36.0)
MCV: 94.1 fL (ref 78.0–100.0)
Platelets: 247 10*3/uL (ref 150–400)
RBC: 4.55 MIL/uL (ref 3.87–5.11)
RDW: 13.7 % (ref 11.5–15.5)
WBC: 8.2 10*3/uL (ref 4.0–10.5)

## 2016-12-25 LAB — LIPASE, BLOOD: Lipase: 19 U/L (ref 11–51)

## 2016-12-25 LAB — POC URINE PREG, ED: PREG TEST UR: NEGATIVE

## 2016-12-25 NOTE — ED Notes (Signed)
Pt called for room, no response from lobby 

## 2016-12-25 NOTE — ED Triage Notes (Signed)
Pt reports emesis for the past few days. Thought it was stomach virus, but symptoms have not passed. No diarrhea. Abd pain only with using the bathroom. Not able to keep down po fluids. No active emesis in triage.

## 2017-04-22 ENCOUNTER — Encounter (HOSPITAL_COMMUNITY): Payer: Self-pay | Admitting: Emergency Medicine

## 2017-04-22 ENCOUNTER — Emergency Department (HOSPITAL_COMMUNITY)
Admission: EM | Admit: 2017-04-22 | Discharge: 2017-04-22 | Disposition: A | Discharge status: Home / Self Care | Payer: Self-pay | Attending: Emergency Medicine | Admitting: Emergency Medicine

## 2017-04-22 DIAGNOSIS — R112 Nausea with vomiting, unspecified: Secondary | ICD-10-CM | POA: Insufficient documentation

## 2017-04-22 DIAGNOSIS — R1084 Generalized abdominal pain: Secondary | ICD-10-CM | POA: Insufficient documentation

## 2017-04-22 DIAGNOSIS — N760 Acute vaginitis: Secondary | ICD-10-CM | POA: Insufficient documentation

## 2017-04-22 DIAGNOSIS — B9689 Other specified bacterial agents as the cause of diseases classified elsewhere: Secondary | ICD-10-CM | POA: Insufficient documentation

## 2017-04-22 DIAGNOSIS — R509 Fever, unspecified: Secondary | ICD-10-CM | POA: Insufficient documentation

## 2017-04-22 HISTORY — DX: Polycystic ovarian syndrome: E28.2

## 2017-04-22 LAB — WET PREP, GENITAL
SPERM: NONE SEEN
TRICH WET PREP: NONE SEEN
WBC WET PREP: NONE SEEN
YEAST WET PREP: NONE SEEN

## 2017-04-22 LAB — CBC
HEMATOCRIT: 42.2 % (ref 36.0–46.0)
HEMOGLOBIN: 14.1 g/dL (ref 12.0–15.0)
MCH: 31.6 pg (ref 26.0–34.0)
MCHC: 33.4 g/dL (ref 30.0–36.0)
MCV: 94.6 fL (ref 78.0–100.0)
Platelets: 260 10*3/uL (ref 150–400)
RBC: 4.46 MIL/uL (ref 3.87–5.11)
RDW: 13.6 % (ref 11.5–15.5)
WBC: 7 10*3/uL (ref 4.0–10.5)

## 2017-04-22 LAB — COMPREHENSIVE METABOLIC PANEL
ALK PHOS: 64 U/L (ref 38–126)
ALT: 23 U/L (ref 14–54)
AST: 22 U/L (ref 15–41)
Albumin: 3.7 g/dL (ref 3.5–5.0)
Anion gap: 8 (ref 5–15)
BILIRUBIN TOTAL: 0.4 mg/dL (ref 0.3–1.2)
BUN: 10 mg/dL (ref 6–20)
CHLORIDE: 105 mmol/L (ref 101–111)
CO2: 23 mmol/L (ref 22–32)
CREATININE: 1 mg/dL (ref 0.44–1.00)
Calcium: 9 mg/dL (ref 8.9–10.3)
Glucose, Bld: 97 mg/dL (ref 65–99)
POTASSIUM: 3.6 mmol/L (ref 3.5–5.1)
Sodium: 136 mmol/L (ref 135–145)
Total Protein: 7.2 g/dL (ref 6.5–8.1)

## 2017-04-22 LAB — I-STAT BETA HCG BLOOD, ED (MC, WL, AP ONLY): I-stat hCG, quantitative: <5 m[IU]/mL (ref <5)

## 2017-04-22 LAB — URINALYSIS, ROUTINE W REFLEX MICROSCOPIC
Bilirubin Urine: NEGATIVE
GLUCOSE, UA: NEGATIVE mg/dL
HGB URINE DIPSTICK: NEGATIVE
Ketones, ur: NEGATIVE mg/dL
LEUKOCYTES UA: NEGATIVE
Nitrite: NEGATIVE
PH: 5 (ref 5.0–8.0)
PROTEIN: NEGATIVE mg/dL
SPECIFIC GRAVITY, URINE: 1.032 — AB (ref 1.005–1.030)

## 2017-04-22 LAB — LIPASE, BLOOD: LIPASE: 25 U/L (ref 11–51)

## 2017-04-22 MED ORDER — SODIUM CHLORIDE 0.9 % IV BOLUS (SEPSIS)
1000.0000 mL | Freq: Once | INTRAVENOUS | Status: AC
  Administered 2017-04-22: 1000 mL via INTRAVENOUS

## 2017-04-22 MED ORDER — MORPHINE SULFATE (PF) 4 MG/ML IV SOLN
4.0000 mg | Freq: Once | INTRAVENOUS | Status: AC
  Administered 2017-04-22: 4 mg via INTRAVENOUS
  Filled 2017-04-22: qty 1

## 2017-04-22 MED ORDER — HYDROCODONE-ACETAMINOPHEN 5-325 MG PO TABS: 1.0000 | ORAL_TABLET | ORAL | 0 refills | Status: DC | PRN

## 2017-04-22 MED ORDER — METRONIDAZOLE 500 MG PO TABS: 500.0000 mg | ORAL_TABLET | Freq: Two times a day (BID) | ORAL | 0 refills | Status: DC

## 2017-04-22 NOTE — ED Triage Notes (Signed)
Pt sts mid abd pain with some dysuria at times x 1 week

## 2017-04-22 NOTE — ED Provider Notes (Signed)
[facility] DEPT Provider Note   CSN: [id number] Arrival date & time: 04/22/17  1827     History   Chief Complaint Chief Complaint  Patient presents with  . Abdominal Pain    HPI Michele Boyd is a 26 y.o. female.  Pt presents to the ED today with dysuria and lower abdominal pain.  The pt said that it has been going on for 1 week.  Pt denies f/c.  She denies n/v.      Past Medical History:  Diagnosis Date  . Fibroid   . PCOS (polycystic ovarian syndrome)     Patient Active Problem List   Diagnosis Date Noted  . Previous cesarean section 04/15/2012  . Insufficient prenatal care 04/15/2012  . 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 Uterine fibroid 02/13/2012    Past Surgical History:  Procedure Laterality Date  . CESAREAN SECTION    . WISDOM TOOTH EXTRACTION      OB History    Gravida Para Term Preterm AB Living   2 2 2     2    SAB TAB Ectopic Multiple Live Births           2       Home Medications    Prior to Admission medications   Medication Sig Start Date End Date Taking? Authorizing Provider  albuterol (PROVENTIL HFA;VENTOLIN HFA) 108 (90 BASE) MCG/ACT inhaler Inhale 2 puffs into the lungs every 4 (four) hours as needed for wheezing or shortness of breath. 02/10/15   Britt Bottom, NP  amoxicillin-clavulanate (AUGMENTIN) 500-125 MG tablet Take 1 tablet (500 mg total) by mouth 3 (three) times daily. 09/21/16   Jonnie Kind, MD  diphenhydrAMINE (BENADRYL) 25 mg capsule Take 25 mg by mouth every 6 (six) hours as needed for allergies.    [provider]  Etonogestrel (NEXPLANON [place]) Inject 1 each into the skin once.    [provider]  fluconazole (DIFLUCAN) 150 MG tablet Take 1 tablet (150 mg total) by mouth daily. Patient not taking: Reported on 02/10/2015 07/21/14   Poe, Mallie Snooks, CNM  HYDROcodone-acetaminophen (NORCO/VICODIN) 5-325 MG tablet Take 1 tablet by mouth every 4 (four) hours as needed. 04/22/17   Isla Pence, MD    loratadine (CLARITIN) 10 MG tablet Take 10 mg by mouth daily as needed for allergies.    [provider]  metroNIDAZOLE (FLAGYL) 500 MG tablet Take 1 tablet (500 mg total) by mouth 2 (two) times daily. 04/22/17   Isla Pence, MD  oxyCODONE-acetaminophen (PERCOCET/ROXICET) 5-325 MG tablet Take 1-2 tablets by mouth every 4 (four) hours as needed for severe pain. 09/20/16   Rasch, Anderson Malta I, NP  Phenylephrine-DM-GG-APAP ([place] FAST-MAX) 5-10-200-325 MG CAPS Take 2 capsules by mouth 3 (three) times daily. 02/10/15   Britt Bottom, NP    Family History Family History  Problem Relation Age of Onset  . Anesthesia problems Neg Hx     Social History Social History  Substance Use Topics  . Smoking status: Current Some Day Smoker    Types: Cigarettes  . Smokeless tobacco: Never Used  . Alcohol use No     Allergies   Patient has no known allergies.   Review of Systems Review of Systems  Constitutional: Positive for fever.  Gastrointestinal: Positive for abdominal pain, nausea and vomiting.  All other systems reviewed and are negative.    Physical Exam Updated Vital Signs BP (!) 142/91   Pulse (!) 106   Temp 99.3 F (37.4 C) (Oral)   Resp  18   SpO2 100%   Physical Exam  Constitutional: She appears well-developed and well-nourished.  HENT:  Head: Normocephalic and atraumatic.  Right Ear: External ear normal.  Left Ear: External ear normal.  Nose: Nose normal.  Mouth/Throat: Oropharynx is clear and moist.  Eyes: Conjunctivae and EOM are normal. Pupils are equal, round, and reactive to light.  Neck: Normal range of motion. Neck supple.  Cardiovascular: Normal rate, regular rhythm, normal heart sounds and intact distal pulses.   Pulmonary/Chest: Effort normal and breath sounds normal.  Abdominal: Soft. Bowel sounds are normal. There is tenderness in the suprapubic area.  Genitourinary: Cervix exhibits discharge. Right adnexum displays no tenderness. Left  adnexum displays no tenderness.  Musculoskeletal: Normal range of motion.  Neurological: She is alert.  Skin: Capillary refill takes less than 2 seconds.  Psychiatric: She has a normal mood and affect. Her behavior is normal. Judgment and thought content normal.  Nursing note and vitals reviewed.    ED Treatments / Results  Labs (all labs ordered are listed, but only abnormal results are displayed) Labs Reviewed  WET PREP, GENITAL - Abnormal; Notable for the following:       Result Value   Clue Cells Wet Prep HPF POC FEW (*)    All other components within normal limits  URINALYSIS, ROUTINE W REFLEX MICROSCOPIC - Abnormal; Notable for the following:    Color, Urine AMBER (*)    APPearance HAZY (*)    Specific Gravity, Urine 1.032 (*)    All other components within normal limits  LIPASE, BLOOD  COMPREHENSIVE METABOLIC PANEL  CBC  I-STAT BETA HCG BLOOD, ED ([facility], [facility], [facility] ONLY)  GC/CHLAMYDIA PROBE AMP ([place]) NOT AT [facility]    EKG  EKG Interpretation None       Radiology No results found.  Procedures Procedures (including critical care time)  Medications Ordered in ED Medications  sodium chloride 0.9 % bolus 1,000 mL (0 mLs Intravenous Stopped 04/22/17 2153)  morphine 4 MG/ML injection 4 mg (4 mg Intravenous Given 04/22/17 2027)     Initial Impression / Assessment and Plan / ED Course  I have reviewed the triage vital signs and the nursing notes.  Pertinent labs & imaging results that were available during my care of the patient were reviewed by me and considered in my medical decision making (see chart for details).    Pt with BV.  She will be treated with flagyl.  She knows to return if worse and f/u with women's clinic.  Final Clinical Impressions(s) / ED Diagnoses   Final diagnoses:  Bacterial vaginosis  Generalized abdominal pain    New Prescriptions New Prescriptions   HYDROCODONE-ACETAMINOPHEN (NORCO/VICODIN) 5-325 MG TABLET    Take 1 tablet by mouth  every 4 (four) hours as needed.   METRONIDAZOLE (FLAGYL) 500 MG TABLET    Take 1 tablet (500 mg total) by mouth 2 (two) times daily.     Isla Pence, MD 04/22/17 2223

## 2017-04-23 LAB — GC/CHLAMYDIA PROBE AMP (~~LOC~~) NOT AT ARMC
Chlamydia: NEGATIVE
Neisseria Gonorrhea: NEGATIVE

## 2017-08-07 ENCOUNTER — Emergency Department (HOSPITAL_COMMUNITY)
Admission: EM | Admit: 2017-08-07 | Discharge: 2017-08-07 | Disposition: A | Discharge status: Home / Self Care | Payer: Self-pay | Attending: Emergency Medicine | Admitting: Emergency Medicine

## 2017-08-07 ENCOUNTER — Emergency Department (HOSPITAL_COMMUNITY): Payer: Self-pay

## 2017-08-07 ENCOUNTER — Encounter (HOSPITAL_COMMUNITY): Payer: Self-pay

## 2017-08-07 DIAGNOSIS — Z79899 Other long term (current) drug therapy: Secondary | ICD-10-CM | POA: Insufficient documentation

## 2017-08-07 DIAGNOSIS — M25561 Pain in right knee: Secondary | ICD-10-CM | POA: Insufficient documentation

## 2017-08-07 DIAGNOSIS — M25461 Effusion, right knee: Secondary | ICD-10-CM | POA: Insufficient documentation

## 2017-08-07 MED ORDER — NAPROXEN 500 MG PO TABS: 500.0000 mg | ORAL_TABLET | Freq: Two times a day (BID) | ORAL | 0 refills | Status: DC

## 2017-08-07 MED ORDER — KETOROLAC TROMETHAMINE 60 MG/2ML IM SOLN
60.0000 mg | Freq: Once | INTRAMUSCULAR | Status: AC
  Administered 2017-08-07: 60 mg via INTRAMUSCULAR
  Filled 2017-08-07: qty 2

## 2017-08-07 NOTE — Discharge Instructions (Signed)
Please wear the knee sleeve and use the crutches as needed for pain control. You can start to bear weight on your right foot as your pain allows.  Please take naproxen with food twice daily for the next 7 days. When you're home, please elevate your right leg on a pillow and apply ice for 15-20 minutes up to 3-4 times a day.   Please call Dr. Gilberto Better office to schedule a follow-up appointment. He is an orthopedist/bone doctor and can further evaluate your knee.   Please continue to drink plenty of fluids and eat. If you have another syncopal episode, please return to the emergency department for evaluation. If you develop new or worsening symptoms such as chest pain, shortness of breath, please return to the emergency department for reevaluation.

## 2017-08-07 NOTE — ED Provider Notes (Signed)
[place] DEPT Provider Note   CSN: [id number] Arrival date & time: 08/07/17  1142     History   Chief Complaint Chief Complaint  Patient presents with  . Knee Pain    HPI Michele Boyd is a 26 y.o. female who presents to the emergency department with a chief complaint of right knee pain. She reports a history of chronic knee pain from previous falls, but reports that she has not had pain in the last several years. She reports the last night she stood up from a sitting position and "fainted". She reports that she is not sure how long she was down or how she landed when she fell. She reports a history of similar syncopal episodes in the past when she has not eaten or drank as much she should earlier in the day. She has no complaints at this time including dizziness, weakness, or lightheadedness. She reports that since the episode, she has had constant right knee pain that is worse with bearing weight. No numbness or weakness. No left knee complaints. She has not established with an orthopedist. No treatment prior to arrival.   The history is provided by the patient. No language interpreter was used.    Past Medical History:  Diagnosis Date  . Fibroid   . PCOS (polycystic ovarian syndrome)     Patient Active Problem List   Diagnosis Date Noted  . Previous cesarean section 04/15/2012  . Insufficient prenatal care 04/15/2012  . 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 Uterine fibroid 02/13/2012    Past Surgical History:  Procedure Laterality Date  . CESAREAN SECTION    . WISDOM TOOTH EXTRACTION      OB History    Gravida Para Term Preterm AB Living   2 2 2     2    SAB TAB Ectopic Multiple Live Births           2       Home Medications    Prior to Admission medications   Medication Sig Start Date End Date Taking? Authorizing Provider  albuterol (PROVENTIL HFA;VENTOLIN HFA) 108 (90 BASE) MCG/ACT inhaler Inhale 2 puffs into the lungs every 4 (four) hours as needed for wheezing or  shortness of breath. 02/10/15   Britt Bottom, NP  amoxicillin-clavulanate (AUGMENTIN) 500-125 MG tablet Take 1 tablet (500 mg total) by mouth 3 (three) times daily. 09/21/16   Jonnie Kind, MD  diphenhydrAMINE (BENADRYL) 25 mg capsule Take 25 mg by mouth every 6 (six) hours as needed for allergies.    [provider]  Etonogestrel (NEXPLANON [place]) Inject 1 each into the skin once.    [provider]  fluconazole (DIFLUCAN) 150 MG tablet Take 1 tablet (150 mg total) by mouth daily. Patient not taking: Reported on 02/10/2015 07/21/14   Poe, Mallie Snooks, CNM  HYDROcodone-acetaminophen (NORCO/VICODIN) 5-325 MG tablet Take 1 tablet by mouth every 4 (four) hours as needed. 04/22/17   Isla Pence, MD  loratadine (CLARITIN) 10 MG tablet Take 10 mg by mouth daily as needed for allergies.    [provider]  metroNIDAZOLE (FLAGYL) 500 MG tablet Take 1 tablet (500 mg total) by mouth 2 (two) times daily. 04/22/17   Isla Pence, MD  oxyCODONE-acetaminophen (PERCOCET/ROXICET) 5-325 MG tablet Take 1-2 tablets by mouth every 4 (four) hours as needed for severe pain. 09/20/16   Rasch, Anderson Malta I, NP  Phenylephrine-DM-GG-APAP ([place] FAST-MAX) 5-10-200-325 MG CAPS Take 2 capsules by mouth 3 (three) times daily. 02/10/15   Britt Bottom,  NP    Family History Family History  Problem Relation Age of Onset  . Anesthesia problems Neg Hx     Social History Social History  Substance Use Topics  . Smoking status: Never Smoker  . Smokeless tobacco: Never Used  . Alcohol use No     Allergies   Patient has no known allergies.   Review of Systems Review of Systems  Constitutional: Negative for activity change, diaphoresis and fever.  Respiratory: Negative for shortness of breath.   Cardiovascular: Negative for chest pain.  Gastrointestinal: Negative for abdominal pain.  Musculoskeletal: Positive for arthralgias, gait problem and myalgias. Negative for back pain and  joint swelling.  Skin: Negative for rash.  Neurological: Positive for syncope and headaches. Negative for weakness and light-headedness.   Physical Exam Updated Vital Signs BP (!) 128/101 (BP Location: Right Arm)   Pulse 79   Temp 98.8 F (37.1 C) (Oral)   Resp 17   Ht 5\' 1"  (1.549 m)   Wt 104.3 kg (230 lb)   SpO2 98%   BMI 43.46 kg/m   Physical Exam  Constitutional: No distress.  Morbidly obese female.  HENT:  Head: Normocephalic.  Eyes: Conjunctivae are normal.  Neck: Neck supple.  Cardiovascular: Normal rate and regular rhythm.  Exam reveals no gallop and no friction rub.   No murmur heard. Pulmonary/Chest: Effort normal. No respiratory distress.  Abdominal: Soft. She exhibits no distension.  Musculoskeletal:  Tender to palpation over the medial joint line of the right knee. No lateral joint line tenderness. No tenderness to palpation over the tibial plateau. The quadriceps and patellar tendons are intact. Decreased range of motion of the knee with flexion secondary to pain full range of motion of the knee with extension, internal and external rotation. Negative anterior and posterior drawer test. Negative valgus and varus stress test.  Patellar pulses are 2+. Sensation is intact throughout the bilateral lower extremities. 2+ DP and PT pulses.  Neurological: She is alert.  Skin: Skin is warm. No rash noted.  Psychiatric: Her behavior is normal.  Nursing note and vitals reviewed.  ED Treatments / Results  Labs (all labs ordered are listed, but only abnormal results are displayed) Labs Reviewed - No data to display  EKG  EKG Interpretation None       Radiology Dg Knee Complete 4 Views Right  Result Date: 08/07/2017 CLINICAL DATA:  Anterior right knee pain.  Multiple falls. EXAM: RIGHT KNEE - COMPLETE 4+ VIEW COMPARISON:  06/16/2012. FINDINGS: Interval minimal medial and posterior patellar spur formation. No fracture or dislocation seen. Small to moderate-sized  effusion. IMPRESSION: 1. Small to moderate-sized effusion. 2. No fracture or dislocation seen. 3. Interval minimal medial and patellofemoral degenerative changes. Electronically Signed   By: Claudie Revering M.D.   On: 08/07/2017 12:42    Procedures Procedures (including critical care time)  Medications Ordered in ED Medications  ketorolac (TORADOL) injection 60 mg (not administered)     Initial Impression / Assessment and Plan / ED Course  I have reviewed the triage vital signs and the nursing notes.  Pertinent labs & imaging results that were available during my care of the patient were reviewed by me and considered in my medical decision making (see chart for details).     26 year old female presenting with acute on chronic right knee pain. She reports the pain began after a syncopal episode when she went from a sitting to a standing position last night. Vital signs stable in the emergency  department today. The patient has no complaints and does not wish to be worked up for this syncopal episode. Given the history, this episode sounds consistent with  Vasovagal syncope. Strict return precautions were given to the patient if she has a similar episode or develops new or worsening symptoms.[place] of the right knee demonstrating a small to moderate sized effusion. Interval minimal medial and patellofemoral degenerative changes also noted. No fracture dislocation seen. On exam, the patient tender to palpation over the medial joint line and has increased pain with flexion of the knee. Given her exam and x-ray findings, I am most suspicious for a medial meniscus tear. The knee does not appear unstable. No suspicion for gout or septic joint at this time. Will provide the patient with a brace and crutches in the emergency Department. Toradol given for pain control. Orthopedic referral given. Encourage conservative management at home with NSAIDs, rest, and ice. Strict return precautions given. No acute  distress. The patient is safe and stable for discharge at this time.  Final Clinical Impressions(s) / ED Diagnoses   Final diagnoses:  Right knee pain, unspecified chronicity  Effusion of right knee    New Prescriptions New Prescriptions   No medications on file     Joanne Gavel, PA-C 08/07/17 1347    Milton Ferguson, MD 08/10/17 [phone]

## 2017-08-07 NOTE — ED Triage Notes (Signed)
Pt endorses right knee pain that began last night. Pt states that she was really hot and "fainted" Pt denies dizziness now and states that she thinks she was dehydrated and is declining blood work. Just wants her knee evaluated. CMS in right leg intact. Pt able to stand but putting minimal amount of weight on right leg. VSS

## 2017-08-23 ENCOUNTER — Encounter (HOSPITAL_COMMUNITY): Payer: Self-pay

## 2017-08-23 ENCOUNTER — Emergency Department (HOSPITAL_COMMUNITY)
Admission: EM | Admit: 2017-08-23 | Discharge: 2017-08-23 | Disposition: A | Discharge status: Home / Self Care | Payer: No Typology Code available for payment source | Attending: Emergency Medicine | Admitting: Emergency Medicine

## 2017-08-23 ENCOUNTER — Emergency Department (HOSPITAL_COMMUNITY): Payer: No Typology Code available for payment source

## 2017-08-23 DIAGNOSIS — M25561 Pain in right knee: Secondary | ICD-10-CM | POA: Diagnosis present

## 2017-08-23 DIAGNOSIS — Y939 Activity, unspecified: Secondary | ICD-10-CM | POA: Insufficient documentation

## 2017-08-23 DIAGNOSIS — Z79899 Other long term (current) drug therapy: Secondary | ICD-10-CM | POA: Insufficient documentation

## 2017-08-23 DIAGNOSIS — Y92411 Interstate highway as the place of occurrence of the external cause: Secondary | ICD-10-CM | POA: Insufficient documentation

## 2017-08-23 DIAGNOSIS — M542 Cervicalgia: Secondary | ICD-10-CM | POA: Insufficient documentation

## 2017-08-23 DIAGNOSIS — Y999 Unspecified external cause status: Secondary | ICD-10-CM | POA: Diagnosis not present

## 2017-08-23 MED ORDER — METHOCARBAMOL 500 MG PO TABS: 500.0000 mg | ORAL_TABLET | Freq: Two times a day (BID) | ORAL | 0 refills | Status: DC

## 2017-08-23 MED ORDER — OXYCODONE-ACETAMINOPHEN 5-325 MG PO TABS
ORAL_TABLET | ORAL | Status: AC
  Administered 2017-08-23: 1 via ORAL
  Filled 2017-08-23: qty 1

## 2017-08-23 MED ORDER — OXYCODONE-ACETAMINOPHEN 5-325 MG PO TABS
1.0000 | ORAL_TABLET | ORAL | Status: DC | PRN
  Administered 2017-08-23: 1 via ORAL

## 2017-08-23 MED ORDER — NAPROXEN 500 MG PO TABS: 500.0000 mg | ORAL_TABLET | Freq: Two times a day (BID) | ORAL | 0 refills | Status: DC

## 2017-08-23 NOTE — Discharge Instructions (Signed)
Take the prescribed medication as directed.  Can use heating pad, warm soaks, etc to help with muscle soreness. Follow-up with your primary care doctor if any ongoing issues. Return to the ED for new or worsening symptoms.
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
## 2017-08-23 NOTE — ED Notes (Signed)
In peds
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
## 2019-11-09 ENCOUNTER — Other Ambulatory Visit: Payer: Self-pay

## 2019-11-09 DIAGNOSIS — Z20822 Contact with and (suspected) exposure to covid-19: Secondary | ICD-10-CM

## 2019-11-11 LAB — NOVEL CORONAVIRUS, NAA: SARS-CoV-2, NAA: DETECTED — AB

## 2019-11-22 ENCOUNTER — Ambulatory Visit: Payer: Self-pay

## 2019-11-22 NOTE — Telephone Encounter (Signed)
Patient called to get some clarification on being with her family on 11/25/2019 but still taking extra precautions. She just need to know if there is anything special that need to be done. Please call Ph# [phone]   Referred to CDC site and informed pt of the following: Advised pt not to attend if sick (pt is out of quarantine) Consider asking guests to avoid contact with people outside their households for 14 days before the event Advised to keep gathering small. No gathering should be larger than 10 people indoors and 50 people outdoors Advised to wear mask, wait 6 feet apart and wash hands frequently Stay outdoors if you can Make sure 6 feet from other when taking mask off to eat No handshaking or hugs Clean and disinfect commonly touched surfaces. Have 1 person serve all food and use disposable dishes and utensils. Pt verbalized understanding.  Reason for Disposition . General information question, no triage required and triager able to answer question  Answer Assessment - Initial Assessment Questions 1. REASON FOR CALL or QUESTION: "What is your reason for calling today?" or "How can I best help you?" or "What question do you have that I can help answer?"     Pt requested guidelines if for her upcoming Christmas get together.  Protocols used: INFORMATION ONLY CALL - NO TRIAGE-A-AH

## 2019-12-04 ENCOUNTER — Encounter (HOSPITAL_COMMUNITY): Payer: Self-pay | Admitting: Family Medicine

## 2019-12-04 ENCOUNTER — Ambulatory Visit (HOSPITAL_COMMUNITY)
Admission: EM | Admit: 2019-12-04 | Discharge: 2019-12-04 | Disposition: A | Discharge status: Home / Self Care | Payer: Self-pay | Attending: Family Medicine | Admitting: Family Medicine

## 2019-12-04 ENCOUNTER — Other Ambulatory Visit: Payer: Self-pay

## 2019-12-04 DIAGNOSIS — R0602 Shortness of breath: Secondary | ICD-10-CM

## 2019-12-04 DIAGNOSIS — R5383 Other fatigue: Secondary | ICD-10-CM

## 2019-12-04 MED ORDER — ALBUTEROL SULFATE HFA 108 (90 BASE) MCG/ACT IN AERS
2.0000 | INHALATION_SPRAY | Freq: Once | RESPIRATORY_TRACT | Status: AC
  Administered 2019-12-04: 13:00:00 2 via RESPIRATORY_TRACT

## 2019-12-04 MED ORDER — AEROCHAMBER PLUS FLO-VU LARGE MISC
Status: AC
  Filled 2019-12-04: qty 1

## 2019-12-04 MED ORDER — AEROCHAMBER PLUS FLO-VU MISC
1.0000 | Freq: Once | Status: AC
  Administered 2019-12-04: 13:00:00 1

## 2019-12-04 MED ORDER — ALBUTEROL SULFATE HFA 108 (90 BASE) MCG/ACT IN AERS
INHALATION_SPRAY | RESPIRATORY_TRACT | Status: AC
  Filled 2019-12-04: qty 6.7
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
## 2019-12-04 NOTE — Discharge Instructions (Addendum)
Encouraged to take over the counter supplements as followings to expedite relief of symptoms: Recommendation management of viral illness include: Vitamin D 5,000 IU daily Vitamin C 500 mg twice daily Zinc 50 mg daily Also you have been prescribed albuterol inhaler for management shortness of breath , may use inhaler 2 puffs every 4-6 hours as needed for management of symptoms.

## 2019-12-04 NOTE — ED Triage Notes (Signed)
Patient presents to Urgent Care with complaints of continuing to feel badly since she was diagnosed w/ covid on 11/09/2019. Patient reports she has no comorbidities and has been trying to stay hydrated but her symptoms are not improving.

## 2020-04-03 ENCOUNTER — Other Ambulatory Visit: Payer: Self-pay

## 2020-04-03 ENCOUNTER — Encounter (HOSPITAL_COMMUNITY): Payer: Self-pay | Admitting: Emergency Medicine

## 2020-04-03 ENCOUNTER — Ambulatory Visit (HOSPITAL_COMMUNITY)
Admission: EM | Admit: 2020-04-03 | Discharge: 2020-04-03 | Disposition: A | Discharge status: Home / Self Care | Payer: Self-pay | Attending: Emergency Medicine | Admitting: Emergency Medicine

## 2020-04-03 DIAGNOSIS — R5383 Other fatigue: Secondary | ICD-10-CM | POA: Insufficient documentation

## 2020-04-03 LAB — CBC WITH DIFFERENTIAL/PLATELET
Abs Immature Granulocytes: 0.03 10*3/uL (ref 0.00–0.07)
Basophils Absolute: 0 10*3/uL (ref 0.0–0.1)
Basophils Relative: 0 %
Eosinophils Absolute: 0.1 10*3/uL (ref 0.0–0.5)
Eosinophils Relative: 2 %
HCT: 45.1 % (ref 36.0–46.0)
Hemoglobin: 15.2 g/dL — ABNORMAL HIGH (ref 12.0–15.0)
Immature Granulocytes: 1 %
Lymphocytes Relative: 25 %
Lymphs Abs: 1.5 10*3/uL (ref 0.7–4.0)
MCH: 32.6 pg (ref 26.0–34.0)
MCHC: 33.7 g/dL (ref 30.0–36.0)
MCV: 96.8 fL (ref 80.0–100.0)
Monocytes Absolute: 0.7 10*3/uL (ref 0.1–1.0)
Monocytes Relative: 11 %
Neutro Abs: 3.8 10*3/uL (ref 1.7–7.7)
Neutrophils Relative %: 61 %
Platelets: 279 10*3/uL (ref 150–400)
RBC: 4.66 MIL/uL (ref 3.87–5.11)
RDW: 13.2 % (ref 11.5–15.5)
WBC: 6.1 10*3/uL (ref 4.0–10.5)
nRBC: 0 % (ref 0.0–0.2)

## 2020-04-03 LAB — BASIC METABOLIC PANEL
Anion gap: 11 (ref 5–15)
BUN: 10 mg/dL (ref 6–20)
CO2: 24 mmol/L (ref 22–32)
Calcium: 9.2 mg/dL (ref 8.9–10.3)
Chloride: 104 mmol/L (ref 98–111)
Creatinine, Ser: 0.73 mg/dL (ref 0.44–1.00)
GFR calc Af Amer: >60 mL/min (ref >60)
GFR calc non Af Amer: >60 mL/min (ref >60)
Glucose, Bld: 103 mg/dL — ABNORMAL HIGH (ref 70–99)
Potassium: 3.9 mmol/L (ref 3.5–5.1)
Sodium: 139 mmol/L (ref 135–145)

## 2020-04-03 LAB — HEMOGLOBIN A1C
Hgb A1c MFr Bld: 5.5 % (ref 4.8–5.6)
Mean Plasma Glucose: 111.15 mg/dL

## 2020-04-03 LAB — TSH: TSH: 0.686 u[IU]/mL (ref 0.350–4.500)

## 2020-04-03 LAB — T4, FREE: Free T4: 0.81 ng/dL (ref 0.61–1.12)

## 2020-04-03 NOTE — ED Triage Notes (Signed)
PT had COVID in December. PT reports all of her symptoms resolved except for continued fatigue. PT reports she has almost passed out at work multiple times. She has no energy and feels weak. This has been constant since her COVID diagnosis.

## 2020-04-03 NOTE — ED Provider Notes (Signed)
[place]    CSN: [id number] Arrival date & time: 04/03/20  1005      History   Chief Complaint Chief Complaint  Patient presents with  . Fatigue    HPI Michele Boyd is a 29 y.o. female.   Who presented to the urgent care with a complaint of fatigue for the past 4 to 5 months.  States she tested positive for Covid in December.  Since then she has been fatigued.  States she feels like she has no energy, feel weak and dizzy at times.  Describes dizziness as feeling like fainting.  Denies similar symptoms in the past.  Denies alleviating or aggravating factors.  Does not not had a PCP.  Denies chills, fever, nausea, vomiting, diarrhea, cough, shortness of breath, body ache, chest tightness, chest pain.  The history is provided by the patient. No language interpreter was used.    Past Medical History:  Diagnosis Date  . Fibroid   . PCOS (polycystic ovarian syndrome)     Patient Active Problem List   Diagnosis Date Noted  . Previous cesarean section 04/15/2012  . Insufficient prenatal care 04/15/2012  . 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 Uterine fibroid 02/13/2012    Past Surgical History:  Procedure Laterality Date  . CESAREAN SECTION    . WISDOM TOOTH EXTRACTION      OB History    Gravida  2   Para  2   Term  2   Preterm      AB      Living  2     SAB      TAB      Ectopic      Multiple      Live Births  2            Home Medications    Prior to Admission medications   Medication Sig Start Date End Date Taking? Authorizing Provider  diphenhydrAMINE (BENADRYL) 25 mg capsule Take 25 mg by mouth every 6 (six) hours as needed for allergies.    [provider]  Etonogestrel (NEXPLANON [place]) Inject 1 each into the skin once.    [provider]  HYDROcodone-acetaminophen (NORCO/VICODIN) 5-325 MG tablet Take 1 tablet by mouth every 4 (four) hours as needed. 04/22/17   Isla Pence, MD  loratadine (CLARITIN) 10 MG tablet Take  10 mg by mouth daily as needed for allergies.    [provider]  methocarbamol (ROBAXIN) 500 MG tablet Take 1 tablet (500 mg total) by mouth 2 (two) times daily. 08/23/17   Larene Pickett, PA-C  naproxen (NAPROSYN) 500 MG tablet Take 1 tablet (500 mg total) by mouth 2 (two) times daily with a meal. 08/23/17   Larene Pickett, PA-C  oxyCODONE-acetaminophen (PERCOCET/ROXICET) 5-325 MG tablet Take 1-2 tablets by mouth every 4 (four) hours as needed for severe pain. 09/20/16   Rasch, Anderson Malta I, NP  Phenylephrine-DM-GG-APAP ([place] FAST-MAX) 5-10-200-325 MG CAPS Take 2 capsules by mouth 3 (three) times daily. 02/10/15   Britt Bottom, NP  albuterol (PROVENTIL HFA;VENTOLIN HFA) 108 (90 BASE) MCG/ACT inhaler Inhale 2 puffs into the lungs every 4 (four) hours as needed for wheezing or shortness of breath. 02/10/15 12/04/19  Britt Bottom, NP    Family History Family History  Problem Relation Age of Onset  . Healthy Mother   . Diabetes Father   . Anesthesia problems Neg Hx     Social History Social History   Tobacco Use  . Smoking status:  Never Smoker  . Smokeless tobacco: Never Used  Substance Use Topics  . Alcohol use: No  . Drug use: No     Allergies   Patient has no known allergies.   Review of Systems Review of Systems  Constitutional: Positive for fatigue.  Respiratory: Negative.   Cardiovascular: Negative.   Neurological: Positive for dizziness.  All other systems reviewed and are negative.    Physical Exam Triage Vital Signs ED Triage Vitals  Enc Vitals Group     BP 04/03/20 1044 108/72     Pulse Rate 04/03/20 1044 78     Resp 04/03/20 1044 18     Temp 04/03/20 1044 98.3 F (36.8 C)     Temp Source 04/03/20 1044 Oral     SpO2 04/03/20 1044 98 %     Weight --      Height --      Head Circumference --      Peak Flow --      Pain Score 04/03/20 1042 0     Pain Loc --      Pain Edu? --      Excl. in [place]? --    No data found.  Updated Vital  Signs BP 108/72   Pulse 78   Temp 98.3 F (36.8 C) (Oral)   Resp 18   LMP 03/25/2020   SpO2 98%   Visual Acuity Right Eye Distance:   Left Eye Distance:   Bilateral Distance:    Right Eye Near:   Left Eye Near:    Bilateral Near:     Physical Exam Vitals and nursing note reviewed.  Constitutional:      General: She is not in acute distress.    Appearance: Normal appearance. She is normal weight. She is not ill-appearing, toxic-appearing or diaphoretic.  HENT:     Head: Normocephalic.     Right Ear: Tympanic membrane, ear canal and external ear normal. There is no impacted cerumen.     Left Ear: Tympanic membrane, ear canal and external ear normal. There is no impacted cerumen.  Cardiovascular:     Rate and Rhythm: Normal rate and regular rhythm.     Pulses: Normal pulses.     Heart sounds: Normal heart sounds. No murmur. No friction rub. No gallop.   Pulmonary:     Effort: Pulmonary effort is normal. No respiratory distress.     Breath sounds: Normal breath sounds. No stridor. No wheezing, rhonchi or rales.  Neurological:     General: No focal deficit present.     Mental Status: She is alert and oriented to person, place, and time.     Cranial Nerves: Cranial nerves are intact. No cranial nerve deficit.     Sensory: Sensation is intact. No sensory deficit.     Motor: Motor function is intact. No weakness.     Coordination: Coordination is intact. Coordination normal.     Gait: Gait is intact. Gait normal.     Deep Tendon Reflexes: Reflexes normal.      UC Treatments / Results  Labs (all labs ordered are listed, but only abnormal results are displayed) Labs Reviewed  CBC WITH DIFFERENTIAL/PLATELET  BASIC METABOLIC PANEL  TSH  T4, FREE  HEMOGLOBIN A1C    EKG   Radiology No results found.  Procedures Procedures (including critical care time)  Medications Ordered in UC Medications - No data to display  Initial Impression / Assessment and Plan / UC  Course  I have reviewed  the triage vital signs and the nursing notes.  Pertinent labs & imaging results that were available during my care of the patient were reviewed by me and considered in my medical decision making (see chart for details).   Patient is stable at discharge.  CBC, BMP, TSH with free T4 and A1c were completed at this visit.  ED EKG showed normal sinus rhythm.  Was advised to follow-up with PCP.Marland Kitchen  CBC Final Clinical Impressions(s) / UC Diagnoses   Final diagnoses:  Other fatigue     Discharge Instructions      CBC, BMP, TSH with free T4 and hemoglobin A1c were completed at this visit.  Will call if results are abnormal. Follow-up with PCP Return or go to ED for worsening of symptoms.    ED Prescriptions    None     PDMP not reviewed this encounter.   Emerson Monte, [place] 04/03/20 1142

## 2020-04-03 NOTE — Discharge Instructions (Addendum)
EKG showed normal sinus rhythm CBC, BMP, TSH with free T4 and hemoglobin A1c were completed at this visit.  Will call if results are abnormal. Follow-up with PCP Return or go to ED for worsening of symptoms.

## 2021-10-18 ENCOUNTER — Ambulatory Visit: Payer: Medicaid Other | Admitting: Obstetrics and Gynecology

## 2023-06-05 ENCOUNTER — Other Ambulatory Visit (HOSPITAL_COMMUNITY): Payer: Self-pay | Admitting: Family

## 2023-06-05 DIAGNOSIS — R102 Pelvic and perineal pain: Secondary | ICD-10-CM

## 2023-06-05 DIAGNOSIS — D259 Leiomyoma of uterus, unspecified: Secondary | ICD-10-CM

## 2023-06-09 ENCOUNTER — Other Ambulatory Visit (HOSPITAL_COMMUNITY): Payer: Self-pay | Admitting: Family

## 2023-06-09 ENCOUNTER — Ambulatory Visit (HOSPITAL_COMMUNITY)
Admission: RE | Admit: 2023-06-09 | Discharge: 2023-06-09 | Disposition: A | Discharge status: Home / Self Care | Payer: Medicaid Other | Source: Ambulatory Visit | Attending: Family | Admitting: Family

## 2023-06-09 DIAGNOSIS — D259 Leiomyoma of uterus, unspecified: Secondary | ICD-10-CM | POA: Diagnosis present

## 2023-06-09 DIAGNOSIS — R102 Pelvic and perineal pain: Secondary | ICD-10-CM | POA: Diagnosis present

## 2023-06-13 ENCOUNTER — Emergency Department (HOSPITAL_BASED_OUTPATIENT_CLINIC_OR_DEPARTMENT_OTHER)
Admission: EM | Admit: 2023-06-13 | Discharge: 2023-06-13 | Disposition: A | Discharge status: Home / Self Care | Payer: Medicaid Other | Attending: Emergency Medicine | Admitting: Emergency Medicine

## 2023-06-13 ENCOUNTER — Emergency Department (HOSPITAL_BASED_OUTPATIENT_CLINIC_OR_DEPARTMENT_OTHER): Payer: Medicaid Other

## 2023-06-13 ENCOUNTER — Encounter (HOSPITAL_BASED_OUTPATIENT_CLINIC_OR_DEPARTMENT_OTHER): Payer: Self-pay | Admitting: Emergency Medicine

## 2023-06-13 ENCOUNTER — Other Ambulatory Visit: Payer: Self-pay

## 2023-06-13 DIAGNOSIS — R109 Unspecified abdominal pain: Secondary | ICD-10-CM | POA: Diagnosis present

## 2023-06-13 DIAGNOSIS — R102 Pelvic and perineal pain: Secondary | ICD-10-CM | POA: Insufficient documentation

## 2023-06-13 LAB — URINALYSIS, ROUTINE W REFLEX MICROSCOPIC
Bilirubin Urine: NEGATIVE
Glucose, UA: NEGATIVE mg/dL
Hgb urine dipstick: NEGATIVE
Ketones, ur: NEGATIVE mg/dL
Leukocytes,Ua: NEGATIVE
Nitrite: NEGATIVE
Protein, ur: NEGATIVE mg/dL
Specific Gravity, Urine: 1.024 (ref 1.005–1.030)
pH: 6 (ref 5.0–8.0)

## 2023-06-13 LAB — COMPREHENSIVE METABOLIC PANEL
ALT: 17 U/L (ref 0–44)
AST: 16 U/L (ref 15–41)
Albumin: 4.4 g/dL (ref 3.5–5.0)
Alkaline Phosphatase: 59 U/L (ref 38–126)
Anion gap: 6 (ref 5–15)
BUN: 11 mg/dL (ref 6–20)
CO2: 26 mmol/L (ref 22–32)
Calcium: 9.5 mg/dL (ref 8.9–10.3)
Chloride: 105 mmol/L (ref 98–111)
Creatinine, Ser: 0.74 mg/dL (ref 0.44–1.00)
GFR, Estimated: >60 mL/min (ref >60)
Glucose, Bld: 102 mg/dL — ABNORMAL HIGH (ref 70–99)
Potassium: 3.8 mmol/L (ref 3.5–5.1)
Sodium: 137 mmol/L (ref 135–145)
Total Bilirubin: 0.5 mg/dL (ref 0.3–1.2)
Total Protein: 7.4 g/dL (ref 6.5–8.1)

## 2023-06-13 LAB — CBC WITH DIFFERENTIAL/PLATELET
Abs Immature Granulocytes: 0.02 10*3/uL (ref 0.00–0.07)
Basophils Absolute: 0 10*3/uL (ref 0.0–0.1)
Basophils Relative: 0 %
Eosinophils Absolute: 0.1 10*3/uL (ref 0.0–0.5)
Eosinophils Relative: 2 %
HCT: 40.3 % (ref 36.0–46.0)
Hemoglobin: 13.8 g/dL (ref 12.0–15.0)
Immature Granulocytes: 0 %
Lymphocytes Relative: 28 %
Lymphs Abs: 1.6 10*3/uL (ref 0.7–4.0)
MCH: 32.2 pg (ref 26.0–34.0)
MCHC: 34.2 g/dL (ref 30.0–36.0)
MCV: 93.9 fL (ref 80.0–100.0)
Monocytes Absolute: 0.7 10*3/uL (ref 0.1–1.0)
Monocytes Relative: 11 %
Neutro Abs: 3.4 10*3/uL (ref 1.7–7.7)
Neutrophils Relative %: 59 %
Platelets: 307 10*3/uL (ref 150–400)
RBC: 4.29 MIL/uL (ref 3.87–5.11)
RDW: 13.5 % (ref 11.5–15.5)
WBC: 5.8 10*3/uL (ref 4.0–10.5)
nRBC: 0 % (ref 0.0–0.2)

## 2023-06-13 LAB — PREGNANCY, URINE: Preg Test, Ur: NEGATIVE

## 2023-06-13 MED ORDER — DOXYCYCLINE HYCLATE 100 MG PO CAPS
100.0000 mg | ORAL_CAPSULE | Freq: Two times a day (BID) | ORAL | 0 refills | Status: DC
  Filled 2023-06-13: qty 14, 7d supply, fill #0

## 2023-06-13 MED ORDER — DOXYCYCLINE HYCLATE 100 MG PO CAPS: 100.0000 mg | ORAL_CAPSULE | Freq: Two times a day (BID) | ORAL | 0 refills | Status: AC

## 2023-06-13 MED ORDER — OXYCODONE-ACETAMINOPHEN 5-325 MG PO TABS
1.0000 | ORAL_TABLET | Freq: Once | ORAL | Status: AC
  Administered 2023-06-13: 1 via ORAL
  Filled 2023-06-13: qty 1

## 2023-06-13 NOTE — ED Provider Notes (Signed)
[place] EMERGENCY DEPARTMENT AT [facility] Provider Note   CSN: [id number] Arrival date & time: 06/13/23  1231     History  Chief Complaint  Patient presents with   Abdominal Pain    Michele Boyd is a 33 y.o. female.  Patient with history of PCOS presents today with complaints of abdominal pain. She states that she has been having pain for about 1 week. She notes that she went to her OB/GYN and was diagnosed with HPV, gonorrhea, and a UTI. She was treated for the UTI with Bactrim, had an IM shot of Rocephin for the gonorrhea on Thursday (2 days ago) and was referred to a different OB/GYN for her HPV diagnosis.  She states that she is still having some abdominal pain which is concerning to her.  She called her primary doctor and they told her to come to the ER for evaluation and potential addition of 'stronger antibiotics.'  Patient states that all of her abdominal pain is in her low abdomen and pelvic area and does not radiate.  She denies any vaginal discharge.  No dysuria or flank pain.  Denies any history of abdominal surgeries.  No nausea, vomiting, or diarrhea.  The history is provided by the patient. No language interpreter was used.  Abdominal Pain      Home Medications Prior to Admission medications   Medication Sig Start Date End Date Taking? Authorizing Provider  diphenhydrAMINE (BENADRYL) 25 mg capsule Take 25 mg by mouth every 6 (six) hours as needed for allergies.    [provider]  Etonogestrel (NEXPLANON [place]) Inject 1 each into the skin once.    [provider]  HYDROcodone-acetaminophen (NORCO/VICODIN) 5-325 MG tablet Take 1 tablet by mouth every 4 (four) hours as needed. 04/22/17   Jacalyn Lefevre, MD  loratadine (CLARITIN) 10 MG tablet Take 10 mg by mouth daily as needed for allergies.    [provider]  methocarbamol (ROBAXIN) 500 MG tablet Take 1 tablet (500 mg total) by mouth 2 (two) times daily. 08/23/17   Garlon Hatchet, PA-C  naproxen (NAPROSYN) 500 MG tablet Take 1 tablet (500 mg total) by mouth 2 (two) times daily with a meal. 08/23/17   Garlon Hatchet, PA-C  oxyCODONE-acetaminophen (PERCOCET/ROXICET) 5-325 MG tablet Take 1-2 tablets by mouth every 4 (four) hours as needed for severe pain. 09/20/16   Rasch, Victorino Dike I, NP  Phenylephrine-DM-GG-APAP (MUCINEX FAST-MAX) 5-10-200-325 MG CAPS Take 2 capsules by mouth 3 (three) times daily. 02/10/15   Harle Battiest, NP  albuterol (PROVENTIL HFA;VENTOLIN HFA) 108 (90 BASE) MCG/ACT inhaler Inhale 2 puffs into the lungs every 4 (four) hours as needed for wheezing or shortness of breath. 02/10/15 12/04/19  Harle Battiest, NP      Allergies    Patient has no known allergies.    Review of Systems   Review of Systems  Genitourinary:  Positive for pelvic pain.  All other systems reviewed and are negative.   Physical Exam Updated Vital Signs BP 123/85 (BP Location: Right Arm)   Pulse 74   Temp 98.6 F (37 C) (Oral)   Resp 16   Ht 5\' 1"  (1.549 m)   Wt 104.3 kg   SpO2 100%   BMI 43.45 kg/m  Physical Exam Vitals and nursing note reviewed.  Constitutional:      General: She is not in acute distress.    Appearance: Normal appearance. She is normal weight. She is not ill-appearing, toxic-appearing or diaphoretic.  HENT:  Head: Normocephalic and atraumatic.  Cardiovascular:     Rate and Rhythm: Normal rate.  Pulmonary:     Effort: Pulmonary effort is normal. No respiratory distress.  Abdominal:     General: Abdomen is flat.     Palpations: Abdomen is soft.     Comments: Mild TTP throughout the low abdomen.  No rebound or guarding.  Musculoskeletal:        General: Normal range of motion.     Cervical back: Normal range of motion.  Skin:    General: Skin is warm and dry.  Neurological:     General: No focal deficit present.     Mental Status: She is alert.  Psychiatric:        Mood and Affect: Mood normal.        Behavior: Behavior  normal.     ED Results / Procedures / Treatments   Labs (all labs ordered are listed, but only abnormal results are displayed) Labs Reviewed  COMPREHENSIVE METABOLIC PANEL - Abnormal; Notable for the following components:      Result Value   Glucose, Bld 102 (*)    All other components within normal limits  CBC WITH DIFFERENTIAL/PLATELET  URINALYSIS, ROUTINE W REFLEX MICROSCOPIC  PREGNANCY, URINE    EKG None  Radiology US PELVIC COMPLETE W TRANSVAGINAL AND TORSION R/O  Result Date: 06/13/2023 CLINICAL DATA:  Pelvic pain x1 week EXAM: TRANSABDOMINAL AND TRANSVAGINAL ULTRASOUND OF PELVIS DOPPLER ULTRASOUND OF OVARIES TECHNIQUE: Both transabdominal and transvaginal ultrasound examinations of the pelvis were performed. Transabdominal technique was performed for global imaging of the pelvis including uterus, ovaries, adnexal regions, and pelvic cul-de-sac. It was necessary to proceed with endovaginal exam following the transabdominal exam to visualize the endometrium and ovaries. Color and duplex Doppler ultrasound was utilized to evaluate blood flow to the ovaries. COMPARISON:  06/09/2023 FINDINGS: Uterus Measurements: 10.3 x 4.8 x 6.9 cm = volume: 181 mL. There is inhomogeneous echogenicity. There is a 7.6 x 8.3 x 8 cm fibroid in the fundus. Coarse calcifications are seen in this fibroid. There is 2.4 x 1.9 x 2.5 cm fibroid in the posterior body. Endometrium Thickness: 3.3 mm.  No focal abnormality visualized. Right ovary Measurements: 5.3 x 3.3 x 3.1 cm = volume: 28.5 mL. 2.4 x 3.1 x 2.1 cm simple appearing cyst, possibly functional ovarian cysts. Left ovary Measurements: 4.1 x 1.5 x 2.2 cm = volume: 7.1 mL. Normal appearance/no adnexal mass. Pulsed Doppler evaluation of both ovaries demonstrates normal low-resistance arterial and venous waveforms. Other findings No abnormal free fluid. IMPRESSION: Enlarged uterus with uterine fibroids. There is 8 cm fibroid in the fundus with coarse  calcifications. There is no evidence of ovarian torsion. There is 3.1 cm simple appearing cyst in the right adnexa, possibly functional right ovarian cyst. Electronically Signed   By: Ernie Avena M.D.   On: 06/13/2023 17:16    Procedures Procedures    Medications Ordered in ED Medications  oxyCODONE-acetaminophen (PERCOCET/ROXICET) 5-325 MG per tablet 1-2 tablet (1 tablet Oral Given 06/13/23 1542)    ED Course/ Medical Decision Making/ A&P                             Medical Decision Making Amount and/or Complexity of Data Reviewed Labs: ordered. Radiology: ordered.  Risk Prescription drug management.   This patient is a 32 y.o. female who presents to the ED for concern of pelvic pain, this involves an extensive number  of treatment options, and is a complaint that carries with it a high risk of complications and morbidity. The emergent differential diagnosis prior to evaluation includes, but is not limited to,  PID, ectopic pregnancy, appendicitis, kidney stone, dysmenorrhea, septic abortion, ruptured ovarian cyst, ovarian torsion, tubo-ovarian abscess, fibroids, endometriosis, diverticulitis, cystitis.   This is not an exhaustive differential.   Past Medical History / Co-morbidities / Social History: history of PCOS   Additional history: Chart reviewed.  Unable to review notes from PCP  Physical Exam: Physical exam performed. The pertinent findings include: Generalized low pelvic TTP without rebound or guarding.  Lab Tests: I ordered, and personally interpreted labs.  The pertinent results include: No acute laboratory abnormalities   Imaging Studies: I ordered imaging studies including Pelvic US. I independently visualized and interpreted imaging which showed   Enlarged uterus with uterine fibroids. There is 8 cm fibroid in the fundus with coarse calcifications.   There is no evidence of ovarian torsion. There is 3.1 cm simple appearing cyst in the right adnexa,  possibly functional right ovarian cyst.  I agree with the radiologist interpretation.   Medications: I ordered medication including percocet  for pain. Reevaluation of the patient after these medicines showed that the patient resolved. I have reviewed the patients home medicines and have made adjustments as needed.   Disposition: After consideration of the diagnostic results and the patients response to treatment, I feel that emergency department workup does not suggest an emergent condition requiring admission or immediate intervention beyond what has been performed at this time. The plan is: Discharge with close outpatient follow-up.  Will send for doxycycline for additional coverage.  No signs of PID, TOA, or Fitz-Hugh Curtis. Pain likely residual from gonorrhea.  She does not have any leukocytosis and her vitals are normal, no concern for appendicitis or other emergent intra-abdominal findings.  Discussed with patient who is understanding and in agreement.  After medications, patient feels ready to go home.  Evaluation and diagnostic testing in the emergency department does not suggest an emergent condition requiring admission or immediate intervention beyond what has been performed at this time.  Plan for discharge with close PCP follow-up.  Patient is understanding and amenable with plan, educated on red flag symptoms that would prompt immediate return.  Patient discharged in stable condition.  Findings and plan of care discussed with supervising physician Dr. Rush Landmark who is in agreement.    Final Clinical Impression(s) / ED Diagnoses Final diagnoses:  Pelvic pain    Rx / DC Orders ED Discharge Orders          Ordered    doxycycline (VIBRAMYCIN) 100 MG capsule  2 times daily        06/13/23 1859          An After Visit Summary was printed and given to the patient.     Vear Clock 06/13/23 1900    Tegeler, Canary Brim, MD 06/14/23 0001

## 2023-06-13 NOTE — Discharge Instructions (Signed)
As we discussed, your workup in the ER today was reassuring for acute findings.  Laboratory evaluation and ultrasound imaging did not reveal any emergent concerns.  I have added doxycycline to your antibiotic regimen and for additional coverage of your already diagnosed conditions.  Please fill and take this medication as prescribed in its entirety.  Please take Tylenol/ibuprofen and use a heating pad for additional symptomatic relief.  Follow-up with your primary care doctor at your earliest convenience.  Return if development of any new or worsening symptoms.

## 2023-06-13 NOTE — ED Triage Notes (Signed)
Pt via pov from home with lower abdominal pain x 1 week. She was seen for her annual exam on Friday, 7/5, diagnosed with UTI, STD. She was given antibiotic and was told she may need something stronger. Pt alert & oriented, nad noted.

## 2023-06-14 ENCOUNTER — Other Ambulatory Visit (HOSPITAL_BASED_OUTPATIENT_CLINIC_OR_DEPARTMENT_OTHER): Payer: Self-pay

## 2023-11-04 ENCOUNTER — Other Ambulatory Visit: Payer: Self-pay

## 2023-11-04 ENCOUNTER — Encounter (HOSPITAL_BASED_OUTPATIENT_CLINIC_OR_DEPARTMENT_OTHER): Payer: Self-pay

## 2023-11-04 ENCOUNTER — Emergency Department (HOSPITAL_BASED_OUTPATIENT_CLINIC_OR_DEPARTMENT_OTHER)
Admission: EM | Admit: 2023-11-04 | Discharge: 2023-11-04 | Disposition: A | Discharge status: Home / Self Care | Payer: Medicaid Other | Attending: Emergency Medicine | Admitting: Emergency Medicine

## 2023-11-04 ENCOUNTER — Other Ambulatory Visit (HOSPITAL_BASED_OUTPATIENT_CLINIC_OR_DEPARTMENT_OTHER): Payer: Self-pay

## 2023-11-04 DIAGNOSIS — L03032 Cellulitis of left toe: Secondary | ICD-10-CM | POA: Diagnosis not present

## 2023-11-04 DIAGNOSIS — B353 Tinea pedis: Secondary | ICD-10-CM | POA: Diagnosis not present

## 2023-11-04 DIAGNOSIS — L03031 Cellulitis of right toe: Secondary | ICD-10-CM | POA: Diagnosis not present

## 2023-11-04 DIAGNOSIS — R21 Rash and other nonspecific skin eruption: Secondary | ICD-10-CM | POA: Diagnosis present

## 2023-11-04 DIAGNOSIS — L03039 Cellulitis of unspecified toe: Secondary | ICD-10-CM

## 2023-11-04 LAB — COMPREHENSIVE METABOLIC PANEL
ALT: 29 U/L (ref 0–44)
AST: 22 U/L (ref 15–41)
Albumin: 4.4 g/dL (ref 3.5–5.0)
Alkaline Phosphatase: 53 U/L (ref 38–126)
Anion gap: 7 (ref 5–15)
BUN: 12 mg/dL (ref 6–20)
CO2: 27 mmol/L (ref 22–32)
Calcium: 9.9 mg/dL (ref 8.9–10.3)
Chloride: 103 mmol/L (ref 98–111)
Creatinine, Ser: 0.81 mg/dL (ref 0.44–1.00)
GFR, Estimated: >60 mL/min (ref >60)
Glucose, Bld: 101 mg/dL — ABNORMAL HIGH (ref 70–99)
Potassium: 3.9 mmol/L (ref 3.5–5.1)
Sodium: 137 mmol/L (ref 135–145)
Total Bilirubin: 0.4 mg/dL (ref <1.2)
Total Protein: 7.7 g/dL (ref 6.5–8.1)

## 2023-11-04 LAB — CBC WITH DIFFERENTIAL/PLATELET
Abs Immature Granulocytes: 0.02 10*3/uL (ref 0.00–0.07)
Basophils Absolute: 0 10*3/uL (ref 0.0–0.1)
Basophils Relative: 0 %
Eosinophils Absolute: 0.1 10*3/uL (ref 0.0–0.5)
Eosinophils Relative: 1 %
HCT: 43.9 % (ref 36.0–46.0)
Hemoglobin: 14.8 g/dL (ref 12.0–15.0)
Immature Granulocytes: 0 %
Lymphocytes Relative: 33 %
Lymphs Abs: 2 10*3/uL (ref 0.7–4.0)
MCH: 32 pg (ref 26.0–34.0)
MCHC: 33.7 g/dL (ref 30.0–36.0)
MCV: 94.8 fL (ref 80.0–100.0)
Monocytes Absolute: 0.5 10*3/uL (ref 0.1–1.0)
Monocytes Relative: 9 %
Neutro Abs: 3.3 10*3/uL (ref 1.7–7.7)
Neutrophils Relative %: 57 %
Platelets: 285 10*3/uL (ref 150–400)
RBC: 4.63 MIL/uL (ref 3.87–5.11)
RDW: 14 % (ref 11.5–15.5)
WBC: 5.9 10*3/uL (ref 4.0–10.5)
nRBC: 0 % (ref 0.0–0.2)

## 2023-11-04 MED ORDER — DOXYCYCLINE HYCLATE 100 MG PO TABS
100.0000 mg | ORAL_TABLET | Freq: Once | ORAL | Status: AC
  Administered 2023-11-04: 100 mg via ORAL
  Filled 2023-11-04: qty 1

## 2023-11-04 MED ORDER — DOXYCYCLINE HYCLATE 100 MG PO CAPS
100.0000 mg | ORAL_CAPSULE | Freq: Two times a day (BID) | ORAL | 0 refills | Status: DC
  Filled 2023-11-04: qty 20, 10d supply, fill #0

## 2023-11-04 NOTE — ED Triage Notes (Signed)
Suspected infection in both foot x1 month. Started in between toes-tried OTC fungal cream- progressed. Toes on both feet skin cracked, sores, weeping. Not diabetic. Sees PCP regularly.

## 2023-11-04 NOTE — Discharge Instructions (Addendum)
Follow up with Dr. Annamary Rummage for further management of recurrent foot infections.   Take Doxycycline twice daily for the secondary bacterial infection involved with the fungal rash. Soak 2-3 times daily in warm water with epson salt, then dry and leave open to air as much as possible.

## 2023-11-04 NOTE — ED Provider Notes (Signed)
[place] EMERGENCY DEPARTMENT AT [facility] Provider Note   CSN: [id number] Arrival date & time: 11/04/23  1314     History  Chief Complaint  Patient presents with   Recurrent Skin Infections    Michele Boyd is a 32 y.o. female.  Patient to ED with severe, painful, malodorous and draining rash to toes bilaterally. She reports having recurrent athlete's foot' infections. This last episode, she used a topical anti-fungal from [facility] and reports the symptoms worsened. There is now a foul odor, some intermittent purulent drainage and pain. No fever.   The history is provided by the patient. No language interpreter was used.       Home Medications Prior to Admission medications   Medication Sig Start Date End Date Taking? Authorizing Provider  doxycycline (VIBRAMYCIN) 100 MG capsule Take 1 capsule (100 mg total) by mouth 2 (two) times daily. 11/04/23  Yes Elpidio Anis, PA-C  diphenhydrAMINE (BENADRYL) 25 mg capsule Take 25 mg by mouth every 6 (six) hours as needed for allergies.    [provider]  Etonogestrel (NEXPLANON [place]) Inject 1 each into the skin once.    [provider]  HYDROcodone-acetaminophen (NORCO/VICODIN) 5-325 MG tablet Take 1 tablet by mouth every 4 (four) hours as needed. 04/22/17   Jacalyn Lefevre, MD  loratadine (CLARITIN) 10 MG tablet Take 10 mg by mouth daily as needed for allergies.    [provider]  methocarbamol (ROBAXIN) 500 MG tablet Take 1 tablet (500 mg total) by mouth 2 (two) times daily. 08/23/17   Garlon Hatchet, PA-C  naproxen (NAPROSYN) 500 MG tablet Take 1 tablet (500 mg total) by mouth 2 (two) times daily with a meal. 08/23/17   Garlon Hatchet, PA-C  oxyCODONE-acetaminophen (PERCOCET/ROXICET) 5-325 MG tablet Take 1-2 tablets by mouth every 4 (four) hours as needed for severe pain. 09/20/16   Rasch, Victorino Dike I, NP  Phenylephrine-DM-GG-APAP (MUCINEX FAST-MAX) 5-10-200-325 MG CAPS Take 2 capsules by mouth  3 (three) times daily. 02/10/15   Harle Battiest, NP  albuterol (PROVENTIL HFA;VENTOLIN HFA) 108 (90 BASE) MCG/ACT inhaler Inhale 2 puffs into the lungs every 4 (four) hours as needed for wheezing or shortness of breath. 02/10/15 12/04/19  Harle Battiest, NP      Allergies    Patient has no known allergies.    Review of Systems   Review of Systems  Physical Exam Updated Vital Signs BP 132/89   Pulse 95   Temp 97.9 F (36.6 C) (Oral)   Resp 16   SpO2 100%  Physical Exam Vitals and nursing note reviewed.  Constitutional:      Appearance: Normal appearance.  Skin:    General: Skin is warm and dry.     Findings: Rash present.     Comments: Raised, confluent crusting and hyperpigmented rash with scaling to toes 2-5 bilateral feet. No bleeding. There is a strong foul odor. No erythema visualized. No swelling proximally.   Neurological:     Mental Status: She is alert and oriented to person, place, and time.     ED Results / Procedures / Treatments   Labs (all labs ordered are listed, but only abnormal results are displayed) Labs Reviewed  COMPREHENSIVE METABOLIC PANEL - Abnormal; Notable for the following components:      Result Value   Glucose, Bld 101 (*)    All other components within normal limits  CBC WITH DIFFERENTIAL/PLATELET    EKG None  Radiology No results found.  Procedures Procedures  Medications Ordered in ED Medications  doxycycline (VIBRA-TABS) tablet 100 mg (has no administration in time range)    ED Course/ Medical Decision Making/ A&P Clinical Course as of 11/04/23 1441  Wed Nov 04, 2023  1422 Patient with recurrent fungal toe infections, now with what appears to be secondary infection. Will start oral abx (Doxycycline), recommend x2-3 daily soaks with epson salts and then leaving to open air as much as possible. Will provide referral to podiatry.  [SU]    Clinical Course User Index [SU] Elpidio Anis, PA-C                                  Medical Decision Making Amount and/or Complexity of Data Reviewed Labs: ordered.           Final Clinical Impression(s) / ED Diagnoses Final diagnoses:  Tinea pedis of both feet  Cellulitis of toe, unspecified laterality    Rx / DC Orders ED Discharge Orders          Ordered    doxycycline (VIBRAMYCIN) 100 MG capsule  2 times daily        11/04/23 1440              Elpidio Anis, PA-C 11/04/23 1441    Anders Simmonds T, DO 11/07/23 [id number]

## 2023-11-04 NOTE — ED Notes (Signed)
Skin assessment deferred to provider. Patient does not wish to show me her feet.

## 2023-11-05 ENCOUNTER — Ambulatory Visit (INDEPENDENT_AMBULATORY_CARE_PROVIDER_SITE_OTHER): Payer: Medicaid Other

## 2023-11-05 ENCOUNTER — Encounter: Payer: Self-pay | Admitting: Podiatry

## 2023-11-05 ENCOUNTER — Ambulatory Visit (INDEPENDENT_AMBULATORY_CARE_PROVIDER_SITE_OTHER): Payer: Medicaid Other | Admitting: Podiatry

## 2023-11-05 DIAGNOSIS — B353 Tinea pedis: Secondary | ICD-10-CM

## 2023-11-05 DIAGNOSIS — M79671 Pain in right foot: Secondary | ICD-10-CM | POA: Diagnosis not present

## 2023-11-05 DIAGNOSIS — M79672 Pain in left foot: Secondary | ICD-10-CM

## 2023-11-05 DIAGNOSIS — L03116 Cellulitis of left lower limb: Secondary | ICD-10-CM

## 2023-11-05 DIAGNOSIS — L03115 Cellulitis of right lower limb: Secondary | ICD-10-CM

## 2023-11-05 MED ORDER — KETOCONAZOLE 2 % EX GEL
1.0000 | Freq: Every day | CUTANEOUS | 0 refills | Status: DC
Start: 2023-11-05 — End: 2023-11-26

## 2023-11-05 MED ORDER — CIPROFLOXACIN HCL 500 MG PO TABS
500.0000 mg | ORAL_TABLET | Freq: Two times a day (BID) | ORAL | 0 refills | Status: AC
Start: 2023-11-05 — End: 2023-11-15

## 2023-11-05 MED ORDER — SULFAMETHOXAZOLE-TRIMETHOPRIM 800-160 MG PO TABS: 1.0000 | ORAL_TABLET | Freq: Two times a day (BID) | ORAL | 0 refills | Status: AC

## 2023-11-05 MED ORDER — TERBINAFINE HCL 250 MG PO TABS
250.0000 mg | ORAL_TABLET | Freq: Every day | ORAL | 0 refills | Status: DC
Start: 2023-11-05 — End: 2024-03-28

## 2023-11-05 MED ORDER — TERBINAFINE HCL 250 MG PO TABS: 250.0000 mg | ORAL_TABLET | Freq: Every day | ORAL | 0 refills | Status: DC

## 2023-11-05 NOTE — Patient Instructions (Signed)
Take medications as directed.  Monitor for signs of worsening infection including worsening redness, worsening swelling, worsening drainage, worsening skin changes, development of systemic symptoms such as nausea, vomiting, fever, chills, chest pain, shortness of breath.  If these develop contact office immediately.

## 2023-11-06 NOTE — Progress Notes (Signed)
Chief Complaint  Patient presents with   Foot Pain    Infection on both feet - toes 2nd to 5th. Discomfort, discoloration/pus for a month. Painful. Does have a small odor.  Recently used over the counter fungal cream, problems seems to have worsened. Seen in urgent care yesterday.  Blood was drawn. Was give antibiotic. Not diabetic    HPI: 32 y.o. female presents today with worsening infection to both feet.  Patient states that she began noticing excessive moisture between her toes about a month ago and started using an over-the-counter foot cream.  Unfortunately this has progressed, she reports swelling of the toes with skin breakdown of the interdigital spaces and skin changes to the affected toes with redness and drainage present.  She was seen in urgent care yesterday.  Labs were drawn, no signs of systemic infection are noted.  Patient denies any diabetes.  She was started on p.o. doxycycline.  She does endorse tobacco use.  Patient denies any nausea, vomiting, fever, chills, chest pain, shortness of breath.  Past Medical History:  Diagnosis Date   Fibroid    PCOS (polycystic ovarian syndrome)     Past Surgical History:  Procedure Laterality Date   CESAREAN SECTION     WISDOM TOOTH EXTRACTION      No Known Allergies  ROS negative except as stated in HPI   Physical Exam: There were no vitals filed for this visit.  General: The patient is alert and oriented x3 in no acute distress.  Dermatology: 3rd through 5th toes primarily as well as the second toes bilaterally notable for increased edema, severe interdigital maceration with skin breakdown in the webspaces.  Erythema to the forefoot bilaterally.  Odor present.  Involves the area just proximal to the sulcus plantarly on both sides.  Eczema-like skin changes to the dorsal and plantar aspect of the affected digits.  Affected digits are tender on palpation  Vascular: Faintly palpable pedal pulses bilaterally. Capillary  refill within normal limits.  Diminished pedal hair growth.  Neurological: Light touch sensation grossly intact bilateral feet.   Musculoskeletal Exam: No pedal deformities noted  Radiographic Exam: 11/05/2023 left and right foot Normal osseous mineralization. Joint spaces preserved.  No fractures or osseous irregularities noted.  Increased soft tissue envelope noted about the toes.  No soft tissue emphysema.  No osteolysis or cortical erosions to suggest osteomyelitis.  Assessment/Plan of Care: 1. Tinea pedis of both feet   2. Cellulitis of foot, left   3. Cellulitis of foot, right   4. Bilateral foot pain      Meds ordered this encounter  Medications   DISCONTD: terbinafine (LAMISIL) 250 MG tablet    Sig: Take 1 tablet (250 mg total) by mouth daily.    Dispense:  30 tablet    Refill:  0   Ketoconazole 2 % GEL    Sig: Apply 1 Application topically daily.    Dispense:  45 g    Refill:  0   sulfamethoxazole-trimethoprim (BACTRIM DS) 800-160 MG tablet    Sig: Take 1 tablet by mouth 2 (two) times daily for 14 days.    Dispense:  28 tablet    Refill:  0   terbinafine (LAMISIL) 250 MG tablet    Sig: Take 1 tablet (250 mg total) by mouth daily.    Dispense:  30 tablet    Refill:  0   ciprofloxacin (CIPRO) 500 MG tablet    Sig: Take 1 tablet (500  mg total) by mouth 2 (two) times daily for 10 days.    Dispense:  20 tablet    Refill:  0   None  Discussed clinical findings with patient today.  # Suspected interdigital tinea pedis infection with superimposed cellulitis -Clinical findings and treatment plan discussed with patient - Culture swab obtained from left fourth interspace area of most severe maceration and skin breakdown - Affected digits flossed with Betadine soaked gauze. - Starting patient on broad-spectrum oral antibiotics until cultures return.  Starting on oral Bactrim and ciprofloxacin for MRSA and pseudomonal coverage - Starting oral terbinafine and topical  ketoconazole gel -Discussed importance of smoking cessation to limit any adverse effects on wound healing and peripheral circulation -Discussed signs and symptoms of worsening infection with the patient and to contact our office or seek medical care if these occur. -Follow-up in 2 weeks.   Santez Woodcox L. Marchia Bond, AACFAS [facility]     [address] [place], [place] [address]                Office [phone]  Fax [phone]

## 2023-11-13 ENCOUNTER — Other Ambulatory Visit: Payer: Self-pay | Admitting: Podiatry

## 2023-11-19 ENCOUNTER — Ambulatory Visit: Payer: Medicaid Other | Admitting: Podiatry

## 2023-11-19 ENCOUNTER — Telehealth: Payer: Self-pay | Admitting: Podiatry

## 2023-11-19 NOTE — Telephone Encounter (Signed)
Pt asked if the medication that hasn't been picked up can be resent to the [facility] on [address]. She stated that it was sent to the one on [address] and didn't pick it up.

## 2023-11-26 ENCOUNTER — Encounter: Payer: Self-pay | Admitting: Podiatry

## 2023-11-26 ENCOUNTER — Ambulatory Visit (INDEPENDENT_AMBULATORY_CARE_PROVIDER_SITE_OTHER): Payer: Medicaid Other | Admitting: Podiatry

## 2023-11-26 DIAGNOSIS — B353 Tinea pedis: Secondary | ICD-10-CM

## 2023-11-26 DIAGNOSIS — L0889 Other specified local infections of the skin and subcutaneous tissue: Secondary | ICD-10-CM | POA: Diagnosis not present

## 2023-11-26 MED ORDER — KETOCONAZOLE 2 % EX GEL
1.0000 | Freq: Two times a day (BID) | CUTANEOUS | 0 refills | Status: AC
Start: 2023-11-26 — End: 2023-12-17

## 2023-11-26 NOTE — Progress Notes (Signed)
       Chief Complaint  Patient presents with   Tinea Pedis    She is here for a follow up and she reports that she has not completed the last prescription. She wants to see if she would need a new prescription. She repots that her blood sugar was elevated 3 weeks ago at 101.     HPI: 32 y.o. female presents for follow-up evaluation of bilateral tinea pedis and cellulitis.  States that she is able to take the oral antibiotic and antifungal agents but was not able to get the topical cream.  Despite this she has had significant improvement to the appearance of the toes and has had less drainage and moisture to this area.  Redness has improved.  Odor has resolved.  Still some dry flaky skin.  Patient denies any nausea, vomiting, fever, chills, chest pain, shortness of breath.  Past Medical History:  Diagnosis Date   Fibroid    PCOS (polycystic ovarian syndrome)     Past Surgical History:  Procedure Laterality Date   CESAREAN SECTION     WISDOM TOOTH EXTRACTION      No Known Allergies  ROS negative except as stated in HPI   Physical Exam: There were no vitals filed for this visit.  General: The patient is alert and oriented x3 in no acute distress.  Dermatology: Some maceration and skin breakdown is present to webspaces 2 through 4 bilaterally.  Improved from previously.  No frank drainage or malodor today.  No erythema.  The previously noted scaly, xerotic, eczema-like changes to the affected toes is still present however this appears improved from previous was affecting mostly the plantar aspect.  Vascular: Faintly palpable pedal pulses bilaterally. Capillary refill within normal limits.  Diminished pedal hair growth.  Neurological: Light touch sensation grossly intact bilateral feet.   Musculoskeletal Exam: No pedal deformities noted  Radiographic Exam: 11/05/2023 left and right foot Normal osseous mineralization. Joint spaces preserved.  No fractures or osseous  irregularities noted.  Increased soft tissue envelope noted about the toes.  No soft tissue emphysema.  No osteolysis or cortical erosions to suggest osteomyelitis.  Assessment/Plan of Care: 1. Tinea pedis of both feet      Meds ordered this encounter  Medications   Ketoconazole 2 % GEL    Sig: Apply 1 Application topically 2 (two) times daily for 21 days.    Dispense:  45 g    Refill:  0   None  Discussed clinical findings with patient today.  # Suspected interdigital tinea pedis infection with superimposed cellulitis - Cellulitis appears resolved at this point. Wound cultures reviewed, positive for pseudomonal infection.  Patient did complete course of oral ciprofloxacin. - Can discontinue all oral antifungal and antibacterial at this point - We ordered topical ketoconazole cream.  Advised patient to use foam toe spacers to allow these areas to dry out following the application. -Follow-up in 2 weeks if symptoms have not resolved.   Michele Boyd L. Marchia Bond, AACFAS [facility]     [address] [place], [place] [address]                Office [phone]  Fax [phone]

## 2023-12-10 ENCOUNTER — Ambulatory Visit: Payer: Medicaid Other | Admitting: Podiatry

## 2024-01-19 ENCOUNTER — Ambulatory Visit: Payer: Medicaid Other

## 2024-01-28 ENCOUNTER — Ambulatory Visit: Payer: Medicaid Other | Admitting: Podiatry

## 2024-02-18 ENCOUNTER — Ambulatory Visit

## 2024-02-18 DIAGNOSIS — Z23 Encounter for immunization: Secondary | ICD-10-CM

## 2024-02-18 DIAGNOSIS — Z719 Counseling, unspecified: Secondary | ICD-10-CM

## 2024-02-18 NOTE — Progress Notes (Signed)
 In nurse clinic for immunizations, requesting final dose of HPV vaccine. Voices no concerns. VIS reviewed and given to patient. Vaccine tolerated well; no issues noted. [facility] updated and copy given to patient.   Abagail Kitchens, RN

## 2024-03-10 ENCOUNTER — Ambulatory Visit: Admitting: Podiatry

## 2024-03-17 ENCOUNTER — Ambulatory Visit (INDEPENDENT_AMBULATORY_CARE_PROVIDER_SITE_OTHER): Admitting: Podiatry

## 2024-03-17 DIAGNOSIS — Z91199 Patient's noncompliance with other medical treatment and regimen due to unspecified reason: Secondary | ICD-10-CM

## 2024-03-18 NOTE — Progress Notes (Signed)
   Complete physical exam  Patient: Michele Boyd   DOB: 09/20/2000   33 y.o. Female  MRN: [id number]  Subjective:    No chief complaint on file.   Michele Boyd is a 33 y.o. female who presents today for a complete physical exam. She reports consuming a {diet types:17450} diet. {types:19826} She generally feels {DESC; WELL/FAIRLY WELL/POORLY:18703}. She reports sleeping {DESC; WELL/FAIRLY WELL/POORLY:18703}. She {does/does not:200015} have additional problems to discuss today.    Most recent fall risk assessment:    05/28/2022   10:42 AM  Fall Risk   Falls in the past year? 0  Number falls in past yr: 0  Injury with Fall? 0  Risk for fall due to : No Fall Risks  Follow up Falls evaluation completed     Most recent depression screenings:    05/28/2022   10:42 AM 04/18/2021   10:46 AM  PHQ 2/9 Scores  PHQ - 2 Score 0 0  PHQ- 9 Score 5     {VISON DENTAL STD PSA (Optional):27386}  {History (Optional):23778}  Patient Care Team: Christen Butter, NP as PCP - General (Nurse Practitioner)   Outpatient Medications Prior to Visit  Medication Sig   fluticasone (FLONASE) 50 MCG/ACT nasal spray Place 2 sprays into both nostrils in the morning and at bedtime. After 7 days, reduce to once daily.   norgestimate-ethinyl estradiol (SPRINTEC 28) 0.25-35 MG-MCG tablet Take 1 tablet by mouth daily.   Nystatin POWD Apply liberally to affected area 2 times per day   spironolactone (ALDACTONE) 100 MG tablet Take 1 tablet (100 mg total) by mouth daily.   No facility-administered medications prior to visit.    ROS        Objective:     There were no vitals taken for this visit. {Vitals History (Optional):23777}  Physical Exam   No results found for any visits on 07/03/22. {Show previous labs (optional):23779}    Assessment & Plan:    Routine Health Maintenance and Physical Exam  Immunization History  Administered Date(s) Administered   DTaP 12/04/1999, 01/30/2000,  04/09/2000, 12/24/2000, 07/09/2004   Hepatitis A 05/05/2008, 05/11/2009   Hepatitis B 09/21/1999, 10/29/1999, 04/09/2000   HiB (PRP-OMP) 12/04/1999, 01/30/2000, 04/09/2000, 12/24/2000   IPV 12/04/1999, 01/30/2000, 09/28/2000, 07/09/2004   Influenza,inj,Quad PF,6+ Mos 08/11/2014   Influenza-Unspecified 11/10/2012   MMR 09/28/2001, 07/09/2004   Meningococcal Polysaccharide 05/10/2012   Pneumococcal Conjugate-13 12/24/2000   Pneumococcal-Unspecified 04/09/2000, 06/23/2000   Tdap 05/10/2012   Varicella 09/28/2000, 05/05/2008    Health Maintenance  Topic Date Due   HIV Screening  Never done   Hepatitis C Screening  Never done   INFLUENZA VACCINE  07/01/2022   PAP-Cervical Cytology Screening  07/03/2022 (Originally 09/19/2020)   PAP SMEAR-Modifier  07/03/2022 (Originally 09/19/2020)   TETANUS/TDAP  07/03/2022 (Originally 05/10/2022)   HPV VACCINES  Discontinued   COVID-19 Vaccine  Discontinued    Discussed health benefits of physical activity, and encouraged her to engage in regular exercise appropriate for her age and condition.  Problem List Items Addressed This Visit   None Visit Diagnoses     Annual physical exam    -  Primary   Cervical cancer screening       Need for Tdap vaccination          No follow-ups on file.     Christen Butter, NP

## 2024-03-28 ENCOUNTER — Ambulatory Visit (INDEPENDENT_AMBULATORY_CARE_PROVIDER_SITE_OTHER)

## 2024-03-28 ENCOUNTER — Encounter: Payer: Self-pay | Admitting: Podiatry

## 2024-03-28 ENCOUNTER — Ambulatory Visit: Admitting: Podiatry

## 2024-03-28 VITALS — Ht 61.0 in | Wt 229.0 lb

## 2024-03-28 DIAGNOSIS — M7752 Other enthesopathy of left foot: Secondary | ICD-10-CM

## 2024-03-28 DIAGNOSIS — L0889 Other specified local infections of the skin and subcutaneous tissue: Secondary | ICD-10-CM | POA: Diagnosis not present

## 2024-03-28 DIAGNOSIS — B353 Tinea pedis: Secondary | ICD-10-CM

## 2024-03-28 DIAGNOSIS — L03116 Cellulitis of left lower limb: Secondary | ICD-10-CM

## 2024-03-28 MED ORDER — TERBINAFINE HCL 250 MG PO TABS: 250.0000 mg | ORAL_TABLET | Freq: Every day | ORAL | 0 refills | Status: DC

## 2024-03-28 MED ORDER — CLINDAMYCIN HCL 300 MG PO CAPS: 300.0000 mg | ORAL_CAPSULE | Freq: Three times a day (TID) | ORAL | 0 refills | Status: DC

## 2024-03-28 MED ORDER — KETOCONAZOLE 2 % EX CREA: 1.0000 | TOPICAL_CREAM | Freq: Every day | CUTANEOUS | 2 refills | Status: AC

## 2024-03-28 NOTE — Patient Instructions (Signed)
 VISIT SUMMARY:  Today, we discussed your recurrent fungal infection on the last two toes of your left foot. You have been experiencing pain and recurrence despite previous treatments. We reviewed your current treatment plan and made some adjustments to help manage and prevent future infections.  YOUR PLAN:  -TINEA PEDIS WITH SECONDARY BACTERIAL INFECTION: Tinea pedis, commonly known as athlete's foot, is a fungal infection that affects the skin on the feet. In your case, it has also led to a secondary bacterial infection. We will start you on a longer course of oral antifungal medication (terbinafine  for 90 days) and continue with the ketoconazole  cream twice daily. Additionally, you will take a two-week course of antibiotics to address the bacterial infection. It's important to keep your feet dry, apply Betadine or iodine ointment once daily, and sanitize or replace old shoes. Also, wash your socks in hot water with bleach and clean your shower and bathroom floors with bleach weekly to prevent reinfection.  INSTRUCTIONS:  Please follow the new treatment plan as discussed. Take the prescribed terbinafine  for 90 days and the antibiotics for two weeks. Continue using the ketoconazole  cream twice daily and apply Betadine or iodine ointment once daily after drying your feet thoroughly. Make sure to sanitize or replace old shoes, wash your socks in hot water with bleach, and clean your shower and bathroom floors with bleach weekly. If you experience any gastrointestinal side effects such as nausea, diarrhea, or vomiting, please contact our office.

## 2024-03-28 NOTE — Progress Notes (Signed)
  Subjective:  Patient ID: Michele Boyd, female    DOB: [birth year],  MRN: [id number]  Chief Complaint  Patient presents with   Foot Pain    Patient is here for left foot pain    Discussed the use of AI scribe software for clinical note transcription with the patient, who gave verbal consent to proceed.  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  She has been experiencing a recurrent fungal infection affecting the last two toes of her left foot over the past week. Initially, all ten toes were involved, but the condition improved before the recent recurrence. The toes are painful, and she is concerned about the infection spreading, as it initially started with one toe and then affected a second.  She has been using ketoconazole  cream, applied twice daily, and has completed a course of three strong oral antifungal medications. Despite these treatments, the infection has returned. She ensures her feet are kept dry to aid in treatment.  She suspects that wearing old or closed shoes might have contributed to the recurrence, as the infection could be living in her environment. As part of her foot care routine, she uses Betadine or iodine ointment to help dry out the affected areas. She finds this process painful but acknowledges its importance in managing moisture and preventing bacterial growth.      Objective:    Physical Exam VASCULAR: DP and PT pulse palpable. Foot is warm and well-perfused. Capillary fill time is brisk. DERMATOLOGIC: Severe maceration with blistering, peeling, and serous drainage in left third and fourth interspace. Scaly itchy rash on both feet. Normal skin turgor, texture, and temperature. NEUROLOGIC: Normal sensation to light touch and pressure. No paresthesias on examination. ORTHOPEDIC: Smooth pain-free range of motion of all examined joints. No ecchymosis or bruising. No gross  deformity. No pain to palpation.   No images are attached to the encounter.    Results Left foot xray shows no bony erosion or gaseous infection   Assessment:   1. Cellulitis of foot, left   2. Tinea pedis of both feet      Plan:  Patient was evaluated and treated and all questions answered.  Assessment and Plan Assessment & Plan Tinea pedis with secondary bacterial infection Recurrent tinea pedis with secondary bacterial infection on the left fourth and fifth toes, presenting with severe maceration, blistering, peeling, and serous drainage. Initial treatment with ketoconazole  cream and oral antifungals was effective, but the infection has recurred, likely due to environmental factors such as moisture and contaminated footwear. A longer course of oral antifungal therapy is warranted due to persistence and recurrence. An antibiotic is added to address the secondary bacterial infection. Discussed potential gastrointestinal side effects of combined antifungal and antibiotic therapy, including nausea, diarrhea, and vomiting. Emphasized environmental control measures to prevent reinfection, including sanitizing footwear and maintaining dry conditions. - Prescribe terbinafine  for 90 days. - Continue ketoconazole  cream twice daily. - Apply Betadine or iodine ointment once daily after thoroughly drying feet. - Prescribe a two-week course of antibiotics. - Advise sanitizing or replacing old shoes and washing socks in hot water with bleach if possible. - Recommend cleaning shower and bathroom floors with bleach weekly.      Return if symptoms worsen or fail to improve.

## 2024-04-07 ENCOUNTER — Ambulatory Visit: Admitting: Podiatry

## 2024-04-07 ENCOUNTER — Ambulatory Visit

## 2024-04-07 ENCOUNTER — Encounter: Payer: Self-pay | Admitting: Podiatry

## 2024-04-07 DIAGNOSIS — L02619 Cutaneous abscess of unspecified foot: Secondary | ICD-10-CM | POA: Diagnosis not present

## 2024-04-07 DIAGNOSIS — L03116 Cellulitis of left lower limb: Secondary | ICD-10-CM

## 2024-04-07 DIAGNOSIS — L03119 Cellulitis of unspecified part of limb: Secondary | ICD-10-CM

## 2024-04-07 DIAGNOSIS — B353 Tinea pedis: Secondary | ICD-10-CM

## 2024-04-07 MED ORDER — CLINDAMYCIN HCL 300 MG PO CAPS
300.0000 mg | ORAL_CAPSULE | Freq: Three times a day (TID) | ORAL | 0 refills | Status: DC
Start: 2024-04-07 — End: 2024-04-18

## 2024-04-07 NOTE — Progress Notes (Signed)
 Chief Complaint  Patient presents with   Tinea Pedis    Rm16/ left foot toes 2-5/ macerated,odor,itching,burning and bleeding/has not started lasmisil/still taking antibiotics.    HPI: 33 y.o. female presents for follow-up evaluation of bilateral tinea pedis and cellulitis.  She had been doing well for some time, developed recurrence and recently saw Dr. Michalene Agee about 2 weeks prior.  She started using the clindamycin  and the ketoconazole .  States that she had not obtaining this.  She has been started on long-term terbinafine  use due to the recurrence of the infection. Left foot 2nd, 3rd and 4th interspaces most effected.  Patient denies any nausea, vomiting, fever, chills, chest pain, shortness of breath.  Past Medical History:  Diagnosis Date   Fibroid    PCOS (polycystic ovarian syndrome)     Past Surgical History:  Procedure Laterality Date   CESAREAN SECTION     WISDOM TOOTH EXTRACTION      No Known Allergies  ROS negative except as stated in HPI   Physical Exam: There were no vitals filed for this visit.  General: The patient is alert and oriented x3 in no acute distress.  VASCULAR: DP and PT pulse palpable. Foot is warm and well-perfused. Capillary fill time is brisk. DERMATOLOGIC: Severe maceration with blistering, peeling, and serous drainage in left second third and fourth interspace. Scaly itchy rash on both feet. Normal skin turgor, texture, and temperature. Maceration with skin breakdown, underlying raw erythematous tissue noted to the interspaces and sulcus of the toes. NEUROLOGIC: Normal sensation to light touch and pressure. No paresthesias on examination. ORTHOPEDIC: Smooth pain-free range of motion of all examined joints. No ecchymosis or bruising. No gross deformity. No pain to palpation.  Radiographic Exam: 04/08/2023 left foot 3 views weightbearing Normal osseous mineralization. Joint spaces preserved.  No fractures or osseous irregularities noted.   Increased soft tissue envelope noted about the toes.  No soft tissue emphysema.  No osteolysis or cortical erosions to suggest osteomyelitis.  Assessment/Plan of Care: 1. Cellulitis and abscess of foot, except toes   2. Tinea pedis of both feet      Meds ordered this encounter  Medications   clindamycin  (CLEOCIN ) 300 MG capsule    Sig: Take 1 capsule (300 mg total) by mouth 3 (three) times daily for 14 days.    Dispense:  42 capsule    Refill:  0   DG FOOT COMPLETE LEFT  Discussed clinical findings with patient today.  # Left foot interdigital tinea pedis with superimposed cellulitis - Patient states that she just received the oral terbinafine .  I have instructed her to restart clindamycin  and start using the terbinafine  daily as directed. -Agree with long-term terbinafine  course as she has had recurrence and developing severe infection once again - Continue using Betadine in between the toes to dry this tissue out.  She can also use the ketoconazole  cream once a day - Betadine gauze flossed in between the toes today. - If patient does not experience improvement over the next week or so we will add gram-negative/pseudomonal coverage with ciprofloxacin  or moxifloxacin. - As I will be away in 2 weeks, will have patient see Dr. Michalene Agee once again, she can follow-up with me going forward. - Discussed importance of pedal hygiene with patient at length in an effort to manage tinea pedis and prevent recurrence.   Yamileth Hayse L. Lunda Salines, AACFAS [facility]     [address]  [place], [place] [address]                Office [phone]  Fax [phone]

## 2024-04-10 ENCOUNTER — Encounter: Payer: Self-pay | Admitting: Podiatry

## 2024-04-15 ENCOUNTER — Other Ambulatory Visit: Payer: Self-pay

## 2024-04-15 ENCOUNTER — Encounter (HOSPITAL_BASED_OUTPATIENT_CLINIC_OR_DEPARTMENT_OTHER): Payer: Self-pay | Admitting: Emergency Medicine

## 2024-04-15 ENCOUNTER — Emergency Department (HOSPITAL_BASED_OUTPATIENT_CLINIC_OR_DEPARTMENT_OTHER): Admitting: Radiology

## 2024-04-15 ENCOUNTER — Inpatient Hospital Stay (HOSPITAL_BASED_OUTPATIENT_CLINIC_OR_DEPARTMENT_OTHER)
Admission: EM | Admit: 2024-04-15 | Discharge: 2024-04-18 | DRG: 603 | Disposition: A | Discharge status: Home / Self Care | Attending: Internal Medicine | Admitting: Internal Medicine

## 2024-04-15 DIAGNOSIS — L0390 Cellulitis, unspecified: Secondary | ICD-10-CM | POA: Diagnosis present

## 2024-04-15 DIAGNOSIS — E66813 Obesity, class 3: Secondary | ICD-10-CM | POA: Diagnosis present

## 2024-04-15 DIAGNOSIS — Z6841 Body Mass Index (BMI) 40.0 and over, adult: Secondary | ICD-10-CM

## 2024-04-15 DIAGNOSIS — B353 Tinea pedis: Secondary | ICD-10-CM | POA: Diagnosis present

## 2024-04-15 DIAGNOSIS — R001 Bradycardia, unspecified: Secondary | ICD-10-CM | POA: Diagnosis not present

## 2024-04-15 DIAGNOSIS — F17210 Nicotine dependence, cigarettes, uncomplicated: Secondary | ICD-10-CM | POA: Diagnosis present

## 2024-04-15 DIAGNOSIS — L03116 Cellulitis of left lower limb: Principal | ICD-10-CM | POA: Diagnosis present

## 2024-04-15 DIAGNOSIS — Z833 Family history of diabetes mellitus: Secondary | ICD-10-CM

## 2024-04-15 DIAGNOSIS — I10 Essential (primary) hypertension: Secondary | ICD-10-CM | POA: Diagnosis not present

## 2024-04-15 DIAGNOSIS — B352 Tinea manuum: Secondary | ICD-10-CM | POA: Diagnosis present

## 2024-04-15 DIAGNOSIS — E282 Polycystic ovarian syndrome: Secondary | ICD-10-CM | POA: Diagnosis present

## 2024-04-15 DIAGNOSIS — Z91013 Allergy to seafood: Secondary | ICD-10-CM

## 2024-04-15 DIAGNOSIS — D259 Leiomyoma of uterus, unspecified: Secondary | ICD-10-CM | POA: Diagnosis present

## 2024-04-15 DIAGNOSIS — R601 Generalized edema: Secondary | ICD-10-CM | POA: Diagnosis present

## 2024-04-15 DIAGNOSIS — Z9109 Other allergy status, other than to drugs and biological substances: Secondary | ICD-10-CM

## 2024-04-15 DIAGNOSIS — Z79899 Other long term (current) drug therapy: Secondary | ICD-10-CM

## 2024-04-15 DIAGNOSIS — R21 Rash and other nonspecific skin eruption: Secondary | ICD-10-CM | POA: Diagnosis present

## 2024-04-15 DIAGNOSIS — Z789 Other specified health status: Secondary | ICD-10-CM | POA: Diagnosis not present

## 2024-04-15 DIAGNOSIS — R631 Polydipsia: Secondary | ICD-10-CM | POA: Diagnosis present

## 2024-04-15 DIAGNOSIS — R0902 Hypoxemia: Secondary | ICD-10-CM | POA: Diagnosis not present

## 2024-04-15 DIAGNOSIS — M2012 Hallux valgus (acquired), left foot: Secondary | ICD-10-CM | POA: Diagnosis present

## 2024-04-15 DIAGNOSIS — N760 Acute vaginitis: Secondary | ICD-10-CM | POA: Diagnosis present

## 2024-04-15 LAB — BASIC METABOLIC PANEL WITH GFR
Anion gap: 13 (ref 5–15)
BUN: 10 mg/dL (ref 6–20)
CO2: 22 mmol/L (ref 22–32)
Calcium: 9.6 mg/dL (ref 8.9–10.3)
Chloride: 105 mmol/L (ref 98–111)
Creatinine, Ser: 0.83 mg/dL (ref 0.44–1.00)
GFR, Estimated: >60 mL/min (ref >60)
Glucose, Bld: 92 mg/dL (ref 70–99)
Potassium: 3.7 mmol/L (ref 3.5–5.1)
Sodium: 139 mmol/L (ref 135–145)

## 2024-04-15 LAB — HEPATIC FUNCTION PANEL
ALT: 20 U/L (ref 0–44)
AST: 21 U/L (ref 15–41)
Albumin: 4 g/dL (ref 3.5–5.0)
Alkaline Phosphatase: 55 U/L (ref 38–126)
Bilirubin, Direct: 0.1 mg/dL (ref 0.0–0.2)
Indirect Bilirubin: 0.5 mg/dL (ref 0.3–0.9)
Total Bilirubin: 0.6 mg/dL (ref 0.0–1.2)
Total Protein: 7.2 g/dL (ref 6.5–8.1)

## 2024-04-15 LAB — CBC WITH DIFFERENTIAL/PLATELET
Abs Immature Granulocytes: 0.01 10*3/uL (ref 0.00–0.07)
Basophils Absolute: 0 10*3/uL (ref 0.0–0.1)
Basophils Relative: 0 %
Eosinophils Absolute: 0.2 10*3/uL (ref 0.0–0.5)
Eosinophils Relative: 5 %
HCT: 40.4 % (ref 36.0–46.0)
Hemoglobin: 13.7 g/dL (ref 12.0–15.0)
Immature Granulocytes: 0 %
Lymphocytes Relative: 37 %
Lymphs Abs: 1.6 10*3/uL (ref 0.7–4.0)
MCH: 31.6 pg (ref 26.0–34.0)
MCHC: 33.9 g/dL (ref 30.0–36.0)
MCV: 93.1 fL (ref 80.0–100.0)
Monocytes Absolute: 0.5 10*3/uL (ref 0.1–1.0)
Monocytes Relative: 13 %
Neutro Abs: 2 10*3/uL (ref 1.7–7.7)
Neutrophils Relative %: 45 %
Platelets: 300 10*3/uL (ref 150–400)
RBC: 4.34 MIL/uL (ref 3.87–5.11)
RDW: 12.9 % (ref 11.5–15.5)
WBC: 4.3 10*3/uL (ref 4.0–10.5)
nRBC: 0 % (ref 0.0–0.2)

## 2024-04-15 LAB — HEMOGLOBIN A1C
Hgb A1c MFr Bld: 5 % (ref 4.8–5.6)
Mean Plasma Glucose: 96.8 mg/dL

## 2024-04-15 LAB — PHOSPHORUS: Phosphorus: 3 mg/dL (ref 2.5–4.6)

## 2024-04-15 LAB — MAGNESIUM: Magnesium: 2.1 mg/dL (ref 1.7–2.4)

## 2024-04-15 LAB — MRSA NEXT GEN BY PCR, NASAL: MRSA by PCR Next Gen: NOT DETECTED

## 2024-04-15 MED ORDER — ACETAMINOPHEN 325 MG PO TABS
650.0000 mg | ORAL_TABLET | Freq: Four times a day (QID) | ORAL | Status: DC | PRN
  Administered 2024-04-18: 650 mg via ORAL
  Filled 2024-04-15: qty 2

## 2024-04-15 MED ORDER — SODIUM CHLORIDE 0.9 % IV SOLN
3.0000 g | Freq: Once | INTRAVENOUS | Status: AC
  Administered 2024-04-15: 3 g via INTRAVENOUS

## 2024-04-15 MED ORDER — ONDANSETRON HCL 4 MG/2ML IJ SOLN: 4.0000 mg | Freq: Four times a day (QID) | INTRAMUSCULAR | Status: DC | PRN

## 2024-04-15 MED ORDER — ENOXAPARIN SODIUM 40 MG/0.4ML IJ SOSY
40.0000 mg | PREFILLED_SYRINGE | INTRAMUSCULAR | Status: DC
  Administered 2024-04-15: 40 mg via SUBCUTANEOUS
  Filled 2024-04-15 (×3): qty 0.4

## 2024-04-15 MED ORDER — ONDANSETRON HCL 4 MG PO TABS: 4.0000 mg | ORAL_TABLET | Freq: Four times a day (QID) | ORAL | Status: DC | PRN

## 2024-04-15 MED ORDER — CIPROFLOXACIN HCL 500 MG PO TABS: 500.0000 mg | ORAL_TABLET | Freq: Two times a day (BID) | ORAL | Status: DC

## 2024-04-15 MED ORDER — FLUCONAZOLE 150 MG PO TABS
150.0000 mg | ORAL_TABLET | ORAL | Status: DC
  Administered 2024-04-15: 150 mg via ORAL
  Filled 2024-04-15: qty 1

## 2024-04-15 MED ORDER — HYDROXYZINE HCL 25 MG PO TABS
25.0000 mg | ORAL_TABLET | ORAL | Status: DC | PRN
  Administered 2024-04-15 – 2024-04-16 (×5): 25 mg via ORAL
  Filled 2024-04-15 (×5): qty 1

## 2024-04-15 MED ORDER — ACETAMINOPHEN 650 MG RE SUPP: 650.0000 mg | Freq: Four times a day (QID) | RECTAL | Status: DC | PRN

## 2024-04-15 MED ORDER — SODIUM CHLORIDE 0.9 % IV SOLN: 2.0000 g | INTRAVENOUS | Status: DC

## 2024-04-15 MED ORDER — SODIUM CHLORIDE 0.9 % IV SOLN: INTRAVENOUS | Status: AC | PRN

## 2024-04-15 MED ORDER — FAMOTIDINE IN NACL 20-0.9 MG/50ML-% IV SOLN
20.0000 mg | Freq: Once | INTRAVENOUS | Status: AC
  Administered 2024-04-15: 20 mg via INTRAVENOUS
  Filled 2024-04-15: qty 50

## 2024-04-15 MED ORDER — PIPERACILLIN-TAZOBACTAM 3.375 G IVPB
3.3750 g | Freq: Three times a day (TID) | INTRAVENOUS | Status: DC
  Administered 2024-04-15 – 2024-04-18 (×9): 3.375 g via INTRAVENOUS
  Filled 2024-04-15 (×8): qty 50

## 2024-04-15 MED ORDER — MORPHINE SULFATE (PF) 4 MG/ML IV SOLN
4.0000 mg | Freq: Once | INTRAVENOUS | Status: AC
  Administered 2024-04-15: 4 mg via INTRAVENOUS
  Filled 2024-04-15: qty 1

## 2024-04-15 MED ORDER — OXYCODONE HCL 5 MG PO TABS
5.0000 mg | ORAL_TABLET | ORAL | Status: DC | PRN
  Administered 2024-04-15 – 2024-04-16 (×5): 5 mg via ORAL
  Filled 2024-04-15 (×5): qty 1

## 2024-04-15 MED ORDER — METHYLPREDNISOLONE SODIUM SUCC 125 MG IJ SOLR
125.0000 mg | Freq: Once | INTRAMUSCULAR | Status: AC
Start: 2024-04-15 — End: 2024-04-15
  Administered 2024-04-15: 125 mg via INTRAVENOUS
  Filled 2024-04-15: qty 2

## 2024-04-15 MED ORDER — MICONAZOLE NITRATE POWD
Freq: Every day | Status: DC
  Administered 2024-04-17: 1 via TOPICAL

## 2024-04-15 MED ORDER — FAMOTIDINE 20 MG PO TABS
20.0000 mg | ORAL_TABLET | Freq: Two times a day (BID) | ORAL | Status: DC
  Administered 2024-04-15 – 2024-04-18 (×6): 20 mg via ORAL
  Filled 2024-04-15 (×6): qty 1

## 2024-04-15 MED ORDER — SODIUM CHLORIDE 0.9 % IV SOLN
3.0000 g | Freq: Four times a day (QID) | INTRAVENOUS | Status: DC
  Administered 2024-04-15: 3 g via INTRAVENOUS
  Filled 2024-04-15: qty 8

## 2024-04-15 NOTE — H&P (Signed)
 History and Physical    Patient: Michele Boyd ZOX:[id number] DOB: 1991-07-13 DOA: 04/15/2024 DOS: the patient was seen and examined on 04/15/2024 PCP: Department, [facility]  Patient coming from: Home  Chief Complaint:  Chief Complaint  Patient presents with   Foot Pain   HPI: Michele Boyd is a 33 y.o. female with medical history significant of class 3 obesity, PCOS, fibroids, vaginosis who presented to the emergency department complaints of having left foot pain.  Per patient, she has had progressively worse skin breakdown on her left toes area for about a month. She stated she has ulcers from these recurrent fungal infections.  She was put on clindamycin  and oral terbinafine .  She has an appointment with podiatry on Monday, but was unable to wait for this due to significant worsening of pain.  She has chills, no fever, fatigue or malaise.  She also endorses having a rash on her neck, abdominal and both arms area bilaterally.  She stated she has been checked for diabetes.  However, stated she has have blurry vision, polyuria or polydipsia.  She had a hemoglobin A1c level done about 4 years ago that was 5.5%.  She denied rhinorrhea, sore throat, wheezing or hemoptysis.  No chest pain, palpitations, diaphoresis, PND, orthopnea or pitting edema of the lower extremities.  No abdominal pain, nausea, emesis, diarrhea, constipation, melena or hematochezia.  No flank pain, dysuria, frequency or hematuria.   Lab work: CBC and BMP were normal.  Imaging: Left foot 3 view x-ray showed moderate generalized edema.  No acute osseous findings.  Mild hallux valgus.   ED course: Initial vital signs were temperature 97.7 F, pulse 79, respiration 20, BP 135/88 mmHg O2 sat 100% on room air.  The patient received Unasyn 3 g IVPB and morphine  4 mg IVP.  Review of Systems: As mentioned in the history of present illness. All other systems reviewed and are negative.  Past Medical History:   Diagnosis Date   Fibroid    PCOS (polycystic ovarian syndrome)    Past Surgical History:  Procedure Laterality Date   CESAREAN SECTION     WISDOM TOOTH EXTRACTION     Social History:  reports that she has been smoking cigarettes. She has never used smokeless tobacco. She reports that she does not drink alcohol and does not use drugs.  No Known Allergies  Family History  Problem Relation Age of Onset   Healthy Mother    Diabetes Father    Anesthesia problems Neg Hx     Prior to Admission medications   Medication Sig Start Date End Date Taking? Authorizing Provider  clindamycin  (CLEOCIN ) 300 MG capsule Take 1 capsule (300 mg total) by mouth 3 (three) times daily for 14 days. 04/07/24 04/21/24  Reina Cara, DPM  diphenhydrAMINE  (BENADRYL ) 25 mg capsule Take 25 mg by mouth every 6 (six) hours as needed for allergies.    [provider]  Etonogestrel  (NEXPLANON  [place]) Inject 1 each into the skin once.    [provider]  ketoconazole  (NIZORAL ) 2 % cream Apply 1 Application topically daily. 03/28/24   McDonald, Olive Better, DPM  loratadine (CLARITIN) 10 MG tablet Take 10 mg by mouth daily as needed for allergies.    [provider]  terbinafine  (LAMISIL ) 250 MG tablet Take 1 tablet (250 mg total) by mouth daily. 03/28/24 06/26/24  McDonald, Olive Better, DPM  albuterol  (PROVENTIL  HFA;VENTOLIN  HFA) 108 (90 BASE) MCG/ACT inhaler Inhale 2 puffs into the lungs every 4 (  four) hours as needed for wheezing or shortness of breath. 02/10/15 12/04/19  Gayl Katos, NP    Physical Exam: Vitals:   04/15/24 0900 04/15/24 1030 04/15/24 1035 04/15/24 1123  BP: 109/78 110/63  108/83  Pulse: 78 80 81 (!) 59  Resp: 18 18  18   Temp: 98.7 F (37.1 C)   98 F (36.7 C)  TempSrc: Oral   Oral  SpO2: 99% 98% 98% 100%  Weight:    99 kg  Height:    5\' 1"  (1.549 m)   Physical Exam Vitals reviewed.  Constitutional:      General: She is awake. She is not in acute distress.    Appearance:  She is ill-appearing.  HENT:     Head: Normocephalic.     Nose: No rhinorrhea.     Mouth/Throat:     Mouth: Mucous membranes are moist.  Eyes:     General: No scleral icterus.    Pupils: Pupils are equal, round, and reactive to light.  Cardiovascular:     Rate and Rhythm: Normal rate and regular rhythm.  Pulmonary:     Effort: Pulmonary effort is normal.     Breath sounds: Normal breath sounds. No wheezing, rhonchi or rales.  Abdominal:     General: Bowel sounds are normal. There is no distension.     Palpations: Abdomen is soft.     Tenderness: There is no abdominal tenderness. There is no guarding.  Musculoskeletal:     Cervical back: Neck supple.     Right lower leg: No edema.     Left lower leg: No edema.  Skin:    General: Skin is warm and dry.     Findings: Erythema, lesion and rash present.  Neurological:     General: No focal deficit present.     Mental Status: She is alert and oriented to person, place, and time.  Psychiatric:        Mood and Affect: Mood normal.        Behavior: Behavior normal. Behavior is cooperative.              Data Reviewed:  Results are pending, will review when available.  Assessment and Plan: Principal Problem:   Cellulitis of left foot Due to secondary infection of:   Tinea pedis Observation/MedSurg. Continue IV fluids. Will start ceftriaxone 2 g IVPB daily. Analgesics as needed. Antiemetics as needed. Will check LFTs before resuming terbinafine . Follow CBC, CMP in AM. Podiatry has been consulted. Check HIV antibody. Check hemoglobin A1c.  Active Problems:   Class 3 obesity Current BMI 41.24 kg/m. Would benefit from lifestyle modifications. Follow-up closely with PCP and/or bariatric clinic.    Tinea manuum Continue terbinafine  pending hepatic function results.    Skin rash Suspect from extensive tinea pedis/tinea manuum. Order hydroxyzine  25 mg every 4 hours as needed.    Advance Care Planning:   Code  Status: Full Code   Consults: Podiatry Barry Boring, D.P.M.)  Family Communication:   Severity of Illness: The appropriate patient status for this patient is OBSERVATION. Observation status is judged to be reasonable and necessary in order to provide the required intensity of service to ensure the patient's safety. The patient's presenting symptoms, physical exam findings, and initial radiographic and laboratory data in the context of their medical condition is felt to place them at decreased risk for further clinical deterioration. Furthermore, it is anticipated that the patient will be medically stable for discharge from the hospital within 2 midnights  of admission.   Author: Danice Dural, MD 04/15/2024 11:42 AM  For on call review [email or web address].   This document was prepared using Dragon voice recognition software and may contain some unintended transcription errors.

## 2024-04-15 NOTE — ED Triage Notes (Signed)
 Patient complaining of left foot pain x1 month. Patient has recurrent fungal infections - prescribed clindamycin  and has been taking for 1 week. Appointment with podiatrist on Monday but unable to wait due to the pain.

## 2024-04-15 NOTE — ED Provider Notes (Signed)
 33 year old female admitted for acute foot infection requiring IV antibiotics.  Requesting pain medicine.  States that she cannot be pregnant and declined pregnancy test.  IV pain medicine ordered.   Flonnie Humphrey, [place] 04/15/24 7829

## 2024-04-15 NOTE — Plan of Care (Signed)
 Plan of Care Note for accepted transfer   Patient name: Michele Boyd ZOX:[id number] DOB: November 19, 1991  Facility requesting transfer: [facility] ED Requesting Provider: Dr. Lula Sale Facility course: 67 female with history of recurrent fungal foot infections presented to the ED for evaluation of continued swelling, pain, and redness of her left foot.  She has been taking Lamisil  and clindamycin  but does not seem to be improving.  Having increasing drainage over the past several days.  Vital signs stable.  No leukocytosis.  X-ray of left foot negative for signs of osteomyelitis.  Patient was started on Unasyn.  Plan of care: The patient is accepted for admission to Med-surg  unit at [facility].  [facility] will assume care on arrival to accepting facility. Until arrival, care as per EDP. However, [facility] available 24/7 for questions and assistance.  Check [email or web address] for on-call coverage.  Nursing staff, please call [facility] Admits & Consults System-Wide number under Amion on patient's arrival so appropriate admitting provider can evaluate the pt.

## 2024-04-15 NOTE — Consult Note (Addendum)
 PODIATRY CONSULTATION  NAME Michele Boyd MRN [id number] DOB 07-06-1991 DOA 04/15/2024   Reason for consult:  Tinea pedis L foot  Attending/Consulting physician: D. Bonita Bussing MD  History of present illness: "Michele Boyd is a 33 y.o. female with medical history significant of class 3 obesity, PCOS, fibroids, vaginosis who presented to the emergency department complaints of having left foot pain.  Per patient, Michele Boyd has had progressively worse skin breakdown on her left toes area for about a month. Michele Boyd stated Michele Boyd has ulcers from these recurrent fungal infections.  Michele Boyd was put on clindamycin  and oral terbinafine .  Michele Boyd has an appointment with podiatry on Monday, but was unable to wait for this due to significant worsening of pain.  Michele Boyd has chills, no fever, fatigue or malaise.  Michele Boyd also endorses having a rash on her neck, abdominal and both arms area bilaterally.  Michele Boyd stated Michele Boyd has been checked for diabetes.  However, stated Michele Boyd has have blurry vision, polyuria or polydipsia.  Michele Boyd had a hemoglobin A1c level done about 4 years ago that was 5.5%.  Michele Boyd denied rhinorrhea, sore throat, wheezing or hemoptysis.  No chest pain, palpitations, diaphoresis, PND, orthopnea or pitting edema of the lower extremities.  No abdominal pain, nausea, emesis, diarrhea, constipation, melena or hematochezia.  No flank pain, dysuria, frequency or hematuria. "  Michele Boyd has been following with Dr. Marvis Sluder for tinea pedis infeciton. Last seen 04/07/24. Michele Boyd was placed on clindamicin and ketoconazole . Had trouble getting the meds. Was recommended to take oral terbinafine , clindamycin  and apply betadine between toes to dessicate the tissues. Consideration for adding pseudomonal coverage if not improved at next visit. Has severe pain to the area. Does admit excessively showering multiple times per day due to pain thinking the showering and cleansing the area would help fight the infection.   Past Medical History:  Diagnosis Date   Fibroid     PCOS (polycystic ovarian syndrome)        Latest Ref Rng & Units 04/15/2024    2:45 AM 11/04/2023    1:33 PM 06/13/2023    1:01 PM  CBC  WBC 4.0 - 10.5 K/uL 4.3  5.9  5.8   Hemoglobin 12.0 - 15.0 g/dL 78.2  95.6  21.3   Hematocrit 36.0 - 46.0 % 40.4  43.9  40.3   Platelets 150 - 400 K/uL 300  285  307        Latest Ref Rng & Units 04/15/2024    2:45 AM 11/04/2023    1:33 PM 06/13/2023    1:01 PM  BMP  Glucose 70 - 99 mg/dL 92  086  578   BUN 6 - 20 mg/dL 10  12  11    Creatinine 0.44 - 1.00 mg/dL 4.69  6.29  5.28   Sodium 135 - 145 mmol/L 139  137  137   Potassium 3.5 - 5.1 mmol/L 3.7  3.9  3.8   Chloride 98 - 111 mmol/L 105  103  105   CO2 22 - 32 mmol/L 22  27  26    Calcium 8.9 - 10.3 mg/dL 9.6  9.9  9.5       Physical Exam: Lower Extremity Exam L DP and Michele Boyd pulses L lateral forefoot edematous and severely tender to palpation to affected area  Maceration and superficial ulceration of the toes and plantar toe sulcus digits 2-5. Signiicant maceration and drainage         ASSESSMENT/PLAN OF CARE 32 y.o. female with PMHx  significant for  class 3 obesity, PCOS, fibroids, vaginosis with Severe tinea pedis infection in the left lateral forefoot and interdigitally, possible bacterial superinfection.   WBC 4.3 XR L foot: Edema. No acute osseous abnormality.  - Discussed severe tinea pedis infection, possible bacterial infection. Likely made worse by excessive showering patient reported doing PTA.  - New wound care orders entered. Clean area with betadine or vashe. Apply miconazole  powder to area after cleansing.  Dry area with betadine and place betadine adaptic placed between toes and on affected area above and below. Wrap with kerlix. Change daily - Do not let Michele Boyd wash foot in shower, want to keep it dry - Recommend pseudomonal coverage for possible bacterial superinfection, high risk for pseudomonas given history - will do zyosn IV for now then de escalate to PO possibly  cipro  pending imrpovement. Dc Ctx, discussed with pharmacy. - Recommend Diflucan  150 mg weekly x 4-6 weeks, ordered - Anticoagulation: per primary - Wound care: as above, orders entered - WB status: WBAT in post op shoe - Will continue to follow   Thank you for the consult.  Please contact me directly with any questions or concerns.           Maridee Shoemaker, DPM [facility]    [address] [place], [place] [address]                Office [phone]  Fax [phone]

## 2024-04-15 NOTE — Consult Note (Signed)
 WOC Nurse Consult Note: Reason for Consult: Requested to assess a wound on left foot due a athletic foot not treated. Performed remotely after notes and photos. Wound type: Full thickness, blister ruptured. Pressure Injury POA: NA Measurement: Dermis exposition between the third and fifth toe. Wound bed: 100% pink Drainage (amount, consistency, odor) Minimum amount, serous. Periwound: macerated. Dressing procedure/placement/frequency: Cleanse with Vashe (#[id number]). Apply Mepitel [phone]) and gauze between the toes, avoiding excessive moisture on this area. Wrap the foot with Kerlix. Change daily or PRN.  WOC team will not plan to follow further.  Please reconsult if further assistance is needed. Thank-you,  Rachel Budds BSN, RN, [employer], WOC  (Pager: [phone])

## 2024-04-15 NOTE — ED Provider Notes (Signed)
 [facility] EMERGENCY DEPARTMENT AT [facility] Provider Note   CSN: [id number] Arrival date & time: 04/15/24  0149     History  Chief Complaint  Patient presents with   Foot Pain    YENTY COCKRAN is a 33 y.o. female.  Patient is a 33 year old female with past medical history of recurrent fungal foot infections.  Patient presenting today with complaints of continued swelling, pain, and redness of her left foot.  She has been taking Lamisil  and clindamycin , but does not seem to be improving.  No fevers or chills.  No injury or trauma.  She reports increased drainage over the past several days.  She has an appointment next week with podiatry, but does not feel as though she can wait that long.       Home Medications Prior to Admission medications   Medication Sig Start Date End Date Taking? Authorizing Provider  clindamycin  (CLEOCIN ) 300 MG capsule Take 1 capsule (300 mg total) by mouth 3 (three) times daily for 14 days. 04/07/24 04/21/24  Reina Cara, DPM  diphenhydrAMINE  (BENADRYL ) 25 mg capsule Take 25 mg by mouth every 6 (six) hours as needed for allergies.    [provider]  Etonogestrel  (NEXPLANON  [place]) Inject 1 each into the skin once.    [provider]  ketoconazole  (NIZORAL ) 2 % cream Apply 1 Application topically daily. 03/28/24   McDonald, Olive Better, DPM  loratadine (CLARITIN) 10 MG tablet Take 10 mg by mouth daily as needed for allergies.    [provider]  terbinafine  (LAMISIL ) 250 MG tablet Take 1 tablet (250 mg total) by mouth daily. 03/28/24 06/26/24  McDonald, Olive Better, DPM  albuterol  (PROVENTIL  HFA;VENTOLIN  HFA) 108 (90 BASE) MCG/ACT inhaler Inhale 2 puffs into the lungs every 4 (four) hours as needed for wheezing or shortness of breath. 02/10/15 12/04/19  Gayl Katos, NP      Allergies    Patient has no known allergies.    Review of Systems   Review of Systems  All other systems reviewed and are negative.   Physical  Exam Updated Vital Signs BP 135/88   Pulse 79   Temp 97.7 F (36.5 C) (Oral)   Resp 20   Ht 5\' 1"  (1.549 m)   Wt 99.8 kg   SpO2 100%   BMI 41.57 kg/m  Physical Exam Vitals and nursing note reviewed.  Constitutional:      Appearance: Normal appearance.  Pulmonary:     Effort: Pulmonary effort is normal.  Musculoskeletal:     Comments: The left foot is noted to have swelling and erythema extended from between the toes and to the midfoot.  There is also copious serous drainage coming from the area.  Skin:    General: Skin is warm and dry.  Neurological:     Mental Status: She is alert and oriented to person, place, and time.         ED Results / Procedures / Treatments   Labs (all labs ordered are listed, but only abnormal results are displayed) Labs Reviewed  BASIC METABOLIC PANEL WITH GFR  CBC WITH DIFFERENTIAL/PLATELET    EKG None  Radiology No results found.  Procedures Procedures    Medications Ordered in ED Medications  Ampicillin -Sulbactam (UNASYN) 3 g in sodium chloride  0.9 % 100 mL IVPB (has no administration in time range)    ED Course/ Medical Decision Making/ A&P  Patient is a 33 year old female presenting with pain and swelling of the left  foot.  This is not improving despite oral clindamycin  and Lamisil .  Patient arrives with stable vital signs and is afebrile.  She does have what appears to be cellulitis extending into the foot along with purulent drainage coming from between the toes.  Laboratory studies obtained including CBC and basic metabolic panel, both of which are unremarkable.  X-ray of the foot shows no evidence for osteomyelitis, but does show edema of the foot.  Patient has been given IV Unasyn.  As she is not improving despite oral antibiotics, I feel as though IV antibiotics are indicated and that patient will require admission.  I have spoken with the hospitalist who agrees to admit.  Final Clinical Impression(s) / ED  Diagnoses Final diagnoses:  None    Rx / DC Orders ED Discharge Orders     None         Orvilla Blander, MD 04/15/24 [phone]

## 2024-04-15 NOTE — Progress Notes (Signed)
 [facility] admitting provider addendum:  The nursing staff reported that observation was continued to have an erythematous and pruritic rash.  I ordered a dose of Solu-Medrol and famotidine.  I called the patient to find out more about the rash symptoms.  She stated that she has had it since earlier this week, at least for 3 or 4 days. She also thinks that she has had amoxicillin  or any other penicillins before with an allergic reaction.  The rash did not worsen with Unasyn this morning.  The rash seems to be getting better with treatment.  If it was clindamycin  or terbinafine  that she got prescribed from the podiatry clinic as those could be the cause.  We will monitor closely.  Lucienne Ryder, MD.

## 2024-04-15 NOTE — ED Notes (Signed)
Called [facility] to transport patient to [facility] rm# [id number]

## 2024-04-15 NOTE — Progress Notes (Signed)
 Orthopedic Tech Progress Note Patient Details:  Michele Boyd 07/23/1991 [id number]  Ortho Devices Type of Ortho Device: Postop shoe/boot Ortho Device/Splint Location: LLE Ortho Device/Splint Interventions: Application   Post Interventions Patient Tolerated: Well  Mearl Spice Shaquella Stamant 04/15/2024, 3:25 PM

## 2024-04-15 NOTE — ED Notes (Signed)
 Previous RN gave report to the floor.Michele AasAaron Boyd

## 2024-04-15 NOTE — Progress Notes (Signed)
 PHARMACY ROUNDING NOTE  Michele Boyd is a 33 y.o. female awaiting admission. A chart review was completed to evaluate prior to admission medications, antibiotic therapy and labs/vitals. The following interventions were made:  Ordering ongoing doses of unasyn  This was discussed with the ED or admitting provider and/or nurse/paramedic.   Jerri Morale, PharmD, BCPS, BCEMP Clinical Pharmacist Please see AMION for all pharmacy numbers 04/15/2024 8:41 AM

## 2024-04-16 DIAGNOSIS — E66813 Obesity, class 3: Secondary | ICD-10-CM | POA: Diagnosis present

## 2024-04-16 DIAGNOSIS — B353 Tinea pedis: Secondary | ICD-10-CM | POA: Diagnosis present

## 2024-04-16 DIAGNOSIS — Z833 Family history of diabetes mellitus: Secondary | ICD-10-CM | POA: Diagnosis not present

## 2024-04-16 DIAGNOSIS — D259 Leiomyoma of uterus, unspecified: Secondary | ICD-10-CM | POA: Diagnosis present

## 2024-04-16 DIAGNOSIS — Z91013 Allergy to seafood: Secondary | ICD-10-CM | POA: Diagnosis not present

## 2024-04-16 DIAGNOSIS — Z79899 Other long term (current) drug therapy: Secondary | ICD-10-CM | POA: Diagnosis not present

## 2024-04-16 DIAGNOSIS — M2012 Hallux valgus (acquired), left foot: Secondary | ICD-10-CM | POA: Diagnosis present

## 2024-04-16 DIAGNOSIS — L03116 Cellulitis of left lower limb: Principal | ICD-10-CM | POA: Diagnosis present

## 2024-04-16 DIAGNOSIS — R631 Polydipsia: Secondary | ICD-10-CM | POA: Diagnosis present

## 2024-04-16 DIAGNOSIS — F17210 Nicotine dependence, cigarettes, uncomplicated: Secondary | ICD-10-CM | POA: Diagnosis present

## 2024-04-16 DIAGNOSIS — R601 Generalized edema: Secondary | ICD-10-CM | POA: Diagnosis present

## 2024-04-16 DIAGNOSIS — E282 Polycystic ovarian syndrome: Secondary | ICD-10-CM | POA: Diagnosis present

## 2024-04-16 DIAGNOSIS — N760 Acute vaginitis: Secondary | ICD-10-CM | POA: Diagnosis present

## 2024-04-16 DIAGNOSIS — Z9109 Other allergy status, other than to drugs and biological substances: Secondary | ICD-10-CM

## 2024-04-16 DIAGNOSIS — Z6841 Body Mass Index (BMI) 40.0 and over, adult: Secondary | ICD-10-CM

## 2024-04-16 LAB — COMPREHENSIVE METABOLIC PANEL WITH GFR
ALT: 20 U/L (ref 0–44)
AST: 19 U/L (ref 15–41)
Albumin: 3.7 g/dL (ref 3.5–5.0)
Alkaline Phosphatase: 60 U/L (ref 38–126)
Anion gap: 10 (ref 5–15)
BUN: 11 mg/dL (ref 6–20)
CO2: 21 mmol/L — ABNORMAL LOW (ref 22–32)
Calcium: 9.1 mg/dL (ref 8.9–10.3)
Chloride: 104 mmol/L (ref 98–111)
Creatinine, Ser: 0.89 mg/dL (ref 0.44–1.00)
GFR, Estimated: >60 mL/min (ref >60)
Glucose, Bld: 127 mg/dL — ABNORMAL HIGH (ref 70–99)
Potassium: 4 mmol/L (ref 3.5–5.1)
Sodium: 135 mmol/L (ref 135–145)
Total Bilirubin: 0.6 mg/dL (ref 0.0–1.2)
Total Protein: 7 g/dL (ref 6.5–8.1)

## 2024-04-16 LAB — HIV ANTIBODY (ROUTINE TESTING W REFLEX): HIV Screen 4th Generation wRfx: NONREACTIVE

## 2024-04-16 LAB — CBC
HCT: 42.6 % (ref 36.0–46.0)
Hemoglobin: 13.7 g/dL (ref 12.0–15.0)
MCH: 31.1 pg (ref 26.0–34.0)
MCHC: 32.2 g/dL (ref 30.0–36.0)
MCV: 96.6 fL (ref 80.0–100.0)
Platelets: 317 10*3/uL (ref 150–400)
RBC: 4.41 MIL/uL (ref 3.87–5.11)
RDW: 12.7 % (ref 11.5–15.5)
WBC: 8.6 10*3/uL (ref 4.0–10.5)
nRBC: 0 % (ref 0.0–0.2)

## 2024-04-16 MED ORDER — HYDROCERIN EX CREA
TOPICAL_CREAM | Freq: Two times a day (BID) | CUTANEOUS | Status: DC
  Administered 2024-04-16: 1 via TOPICAL
  Filled 2024-04-16: qty 113

## 2024-04-16 MED ORDER — MELATONIN 5 MG PO TABS
5.0000 mg | ORAL_TABLET | Freq: Once | ORAL | Status: AC
Start: 2024-04-16 — End: 2024-04-16
  Administered 2024-04-16: 5 mg via ORAL
  Filled 2024-04-16: qty 1

## 2024-04-16 MED ORDER — MORPHINE SULFATE (PF) 2 MG/ML IV SOLN
2.0000 mg | INTRAVENOUS | Status: AC | PRN
  Administered 2024-04-16 (×2): 2 mg via INTRAVENOUS
  Filled 2024-04-16 (×2): qty 1

## 2024-04-16 MED ORDER — MORPHINE SULFATE (PF) 2 MG/ML IV SOLN
2.0000 mg | INTRAVENOUS | Status: AC | PRN
  Administered 2024-04-17 (×2): 2 mg via INTRAVENOUS
  Filled 2024-04-16 (×2): qty 1

## 2024-04-16 MED ORDER — KETOROLAC TROMETHAMINE 15 MG/ML IJ SOLN
30.0000 mg | Freq: Four times a day (QID) | INTRAMUSCULAR | Status: AC | PRN
  Administered 2024-04-16: 30 mg via INTRAVENOUS
  Filled 2024-04-16: qty 2

## 2024-04-16 NOTE — Progress Notes (Signed)
   PODIATRY PROGRESS NOTE  NAME LYLIAN SANAGUSTIN MRN [id number] DOB July 27, 1991 DOA 04/15/2024   Chief Complaint  Patient presents with   Foot Pain   Assessment:  1.  Severe tinea pedis with maceration 2.  Superficial interdigital wounds with cellulitis left forefoot  Plan of Care:  -Patient seen at bedside -Pictures taken this morning compared to yesterday demonstrate significant improvement of the maceration with erythema and skin breakdown to the left forefoot.   -Labs stable.  No intervention from a podiatry standpoint. -Continue to monitor.  Continue daily dressing changes.   -Continue WBAT surgical shoe -Continue Diflucan  150 mg weekly x 4 to 6 weeks -Recommend discharge on broad-spectrum oral antibiotics x 7 days.  Possible allergic reaction to clindamycin . -Would anticipate that the patient is okay for discharge over the next 24-48 hours.  Podiatry will sign off  Past Medical History:  Diagnosis Date   Fibroid    PCOS (polycystic ovarian syndrome)        Latest Ref Rng & Units 04/16/2024    7:33 AM 04/15/2024    2:45 AM 11/04/2023    1:33 PM  CBC  WBC 4.0 - 10.5 K/uL 8.6  4.3  5.9   Hemoglobin 12.0 - 15.0 g/dL 40.9  81.1  91.4   Hematocrit 36.0 - 46.0 % 42.6  40.4  43.9   Platelets 150 - 400 K/uL 317  300  285        Latest Ref Rng & Units 04/16/2024    7:33 AM 04/15/2024    2:45 AM 11/04/2023    1:33 PM  BMP  Glucose 70 - 99 mg/dL 782  92  956   BUN 6 - 20 mg/dL 11  10  12    Creatinine 0.44 - 1.00 mg/dL 2.13  0.86  5.78   Sodium 135 - 145 mmol/L 135  139  137   Potassium 3.5 - 5.1 mmol/L 4.0  3.7  3.9   Chloride 98 - 111 mmol/L 104  105  103   CO2 22 - 32 mmol/L 21  22  27    Calcium 8.9 - 10.3 mg/dL 9.1  9.6  9.9       Dot Gazella, DPM [facility]  Dr. Dot Gazella, DPM    [address] [place], [place] [address]                Office [phone]  Fax [phone]

## 2024-04-16 NOTE — Progress Notes (Signed)
 PROGRESS NOTE  Michele Boyd  DOB: 10-21-1991  PCP: Department, [facility] [id number]  DOA: 04/15/2024  LOS: 0 days  Hospital Day: 2  Brief narrative: Michele Boyd is a 33 y.o. female with PMH significant for morbid obesity, PCOS, fibroids, vaginosis 5/16, patient presented to ED with complaint of left foot pain.  Patient had had progressively worse skin breakdown on her left toes area for about a month. She stated she has ulcers from these recurrent fungal infections.   5/8, she was seen by podiatry as an outpatient and put on clindamycin  and oral terbinafine .  Her symptoms continue to progress.  She has an upcoming appointment with podiatry on Monday 5/19, but was unable to wait for this due to significant worsening of pain.   She also reported having a rash on her neck, abdominal and both arms area bilaterally.    In the ED, patient was afebrile, hemodynamically stable. On exam, patient was noted to have left lateral forefoot edematous and severely tender to palpation.  Significant maceration, superficial ulceration and drainage of the toes and plantar toe sulcus digits 2-5 CBC, CMP unremarkable Left foot x-ray showed moderate generalized edema.  No acute osseous findings.  Mild hallux valgus. Patient was started on IV Unasyn , IV morphine  Admitted to [facility] Podiatry was consulted   Subjective: Patient was seen and examined this morning. Young African-American female.  Sitting up in bed. With the help of bedside RN, I examined her wound and took pictures.  Available in media section Complains of persistent pain in requesting pain medicines Chart reviewed Remains afebrile, hemodynamically stable Repeat labs this morning remain unremarkable  Assessment and plan: Cellulitis of left foot Severe tinea pedis Presented with worsening left foot infection in the setting of PCOS Did not respond to outpatient course of antibiotic and antifungal Not septic at  presentation. Imaging without evidence of bony involvement Seen by podiatry yesterday. With the help of bedside RN, I examined her wound and took pictures.  Available in media section.  Patient states it looks better and is not foul-smelling anymore. Recommended  -IV Zosyn  for now. - Diflucan  150 mg weekly for 4 to 6 weeks - Local wound care  - Keep the wound dry - Weightbearing status and postop shoe - Pain control with as needed oxycodone  as needed IV morphine  for the next 24 to 48 hours  Skin rash Question drug reaction Patient reports red papular as well as lesions on her hands.  She had had them for 3 to 4 days prior to presentation.  Unclear etiology.  Could be related to oral clindamycin  or terbinafine  that she was recently started as an outpatient.  However unclear to prove.  Liver enzymes normal.  Both suspected medicines are currently on hold. 5/16, was given a dose of IV Solu-Medrol  and famotidine .  Morbid Obesity  Body mass index is 41.24 kg/m. Patient has been advised to make an attempt to improve diet and exercise patterns to aid in weight loss.  PCOS Not on any treatment currently.    Mobility: Encourage ambulation  Goals of care   Code Status: Full Code     DVT prophylaxis:  enoxaparin  (LOVENOX ) injection 40 mg Start: 04/15/24 2200   Antimicrobials: IV Zosyn , weekly oral fluconazole  Fluid: None Consultants: Podiatry Family Communication: None at bedside  Status: Observation Level of care:  Med-Surg   Patient is from: Home Needs to continue in-hospital care: Continues to need antimicrobial IV Anticipated d/c to: Hopefully home in 1  to 2 days    Diet:  Diet Order             Diet regular Room service appropriate? Yes; Fluid consistency: Thin  Diet effective now                   Scheduled Meds:  enoxaparin  (LOVENOX ) injection  40 mg Subcutaneous Q24H   famotidine   20 mg Oral BID   fluconazole   150 mg Oral Weekly   miconazole  nitrate    Topical Daily    PRN meds: acetaminophen  **OR** acetaminophen , hydrOXYzine , ketorolac , morphine  injection, ondansetron  **OR** ondansetron  (ZOFRAN ) IV, oxyCODONE    Infusions:   piperacillin -tazobactam (ZOSYN )  IV 3.375 g (04/16/24 0830)    Antimicrobials: Anti-infectives (From admission, onward)    Start     Dose/Rate Route Frequency Ordered Stop   04/15/24 2000  ciprofloxacin  (CIPRO ) tablet 500 mg  Status:  Discontinued        500 mg Oral 2 times daily 04/15/24 1427 04/15/24 1440   04/15/24 1600  fluconazole  (DIFLUCAN ) tablet 150 mg        150 mg Oral Weekly 04/15/24 1417 05/13/24 1559   04/15/24 1600  piperacillin -tazobactam (ZOSYN ) IVPB 3.375 g        3.375 g 12.5 mL/hr over 240 Minutes Intravenous Every 8 hours 04/15/24 1440     04/15/24 1500  cefTRIAXone (ROCEPHIN) 2 g in sodium chloride  0.9 % 100 mL IVPB  Status:  Discontinued        2 g 200 mL/hr over 30 Minutes Intravenous Every 24 hours 04/15/24 1221 04/15/24 1440   04/15/24 0900  Ampicillin -Sulbactam (UNASYN ) 3 g in sodium chloride  0.9 % 100 mL IVPB  Status:  Discontinued        3 g 200 mL/hr over 30 Minutes Intravenous Every 6 hours 04/15/24 0841 04/15/24 1221   04/15/24 0215  Ampicillin -Sulbactam (UNASYN ) 3 g in sodium chloride  0.9 % 100 mL IVPB        3 g 200 mL/hr over 30 Minutes Intravenous  Once 04/15/24 0214 04/15/24 0320       Objective: Vitals:   04/15/24 1956 04/16/24 0028  BP: 109/69 112/70  Pulse: 71 77  Resp: 18 18  Temp: 98.1 F (36.7 C) 97.9 F (36.6 C)  SpO2: 100% 100%    Intake/Output Summary (Last 24 hours) at 04/16/2024 1127 Last data filed at 04/16/2024 0900 Gross per 24 hour  Intake 177.44 ml  Output --  Net 177.44 ml   Filed Weights   04/15/24 0156 04/15/24 1123  Weight: 99.8 kg 99 kg   Weight change: -0.791 kg Body mass index is 41.24 kg/m.   Physical Exam: General exam: Pleasant, young hemoglobin H African-American female Skin: No rashes, lesions or ulcers. HEENT:  Atraumatic, normocephalic, no obvious bleeding Lungs: Clear to auscultation bilaterally,  CVS: S1, S2, no murmur,   GI/Abd: Soft, nontender, distended from obesity, bowel sound present,   CNS: Alert, awake, oriented x 3 Psychiatry: Mood appropriate Extremities: No pedal edema, no calf tenderness, left foot picture attached      Data Review: I have personally reviewed the laboratory data and studies available.  F/u labs ordered Unresulted Labs (From admission, onward)     Start     Ordered   04/16/24 0500  HIV Antibody (routine testing w rflx)  (HIV Antibody (Routine testing w reflex) panel)  Tomorrow morning,   R        04/15/24 1130  Signed, Hoyt Macleod, MD [facility] 04/16/2024

## 2024-04-17 DIAGNOSIS — L03116 Cellulitis of left lower limb: Secondary | ICD-10-CM | POA: Diagnosis not present

## 2024-04-17 MED ORDER — OXYCODONE HCL 5 MG PO TABS
5.0000 mg | ORAL_TABLET | ORAL | Status: DC | PRN
  Administered 2024-04-17 (×2): 5 mg via ORAL
  Filled 2024-04-17 (×2): qty 1

## 2024-04-17 NOTE — Plan of Care (Signed)

## 2024-04-17 NOTE — Plan of Care (Signed)
  Problem: Education: Goal: Knowledge of General Education information will improve Description: Including pain rating scale, medication(s)/side effects and non-pharmacologic comfort measures Outcome: Progressing   Problem: Health Behavior/Discharge Planning: Goal: Ability to manage health-related needs will improve Outcome: Progressing   Problem: Clinical Measurements: Goal: Ability to maintain clinical measurements within normal limits will improve Outcome: Progressing Goal: Will remain free from infection Outcome: Progressing Goal: Diagnostic test results will improve Outcome: Progressing   Problem: Activity: Goal: Risk for activity intolerance will decrease Outcome: Progressing   Problem: Coping: Goal: Level of anxiety will decrease Outcome: Progressing   Problem: Pain Managment: Goal: General experience of comfort will improve and/or be controlled Outcome: Progressing   Problem: Safety: Goal: Ability to remain free from injury will improve Outcome: Progressing   Problem: Skin Integrity: Goal: Risk for impaired skin integrity will decrease Outcome: Progressing   Problem: Clinical Measurements: Goal: Ability to avoid or minimize complications of infection will improve Outcome: Progressing   Problem: Skin Integrity: Goal: Skin integrity will improve Outcome: Progressing

## 2024-04-17 NOTE — Progress Notes (Signed)
 PROGRESS NOTE  Michele Boyd  DOB: 1991-03-13  PCP: Department, [facility] WJX:[id number]  DOA: 04/15/2024  LOS: 1 day  Hospital Day: 3  Brief narrative: Michele Boyd is a 33 y.o. female with PMH significant for morbid obesity, PCOS, fibroids, vaginosis 5/16, patient presented to ED with complaint of left foot pain.  Patient had had progressively worse skin breakdown on her left toes area for about a month. She stated she has ulcers from these recurrent fungal infections.   5/8, she was seen by podiatry as an outpatient and put on clindamycin  and oral terbinafine .  Her symptoms continue to progress.  She has an upcoming appointment with podiatry on Monday 5/19, but was unable to wait for this due to significant worsening of pain.   She also reported having a rash on her neck, abdominal and both arms area bilaterally.    In the ED, patient was afebrile, hemodynamically stable. On exam, patient was noted to have left lateral forefoot edematous and severely tender to palpation.  Significant maceration, superficial ulceration and drainage of the toes and plantar toe sulcus digits 2-5 CBC, CMP unremarkable Left foot x-ray showed moderate generalized edema.  No acute osseous findings.  Mild hallux valgus. Patient was started on IV Unasyn , IV morphine  Admitted to [facility] Podiatry was consulted   Subjective: Patient was seen and examined this morning. Lying down. Feels better but still concerned about the look of the wound and the widespread rashes. Doesn't feel comfortable going home today.   Assessment and plan: Cellulitis of left foot Severe tinea pedis Presented with worsening left foot infection in the setting of PCOS Did not respond to outpatient course of antibiotic and antifungal Not septic at presentation. Imaging without evidence of bony involvement Seen by podiatry. Wound healing better. Recommended  -IV Zosyn  for now. - Diflucan  150 mg weekly for 4 to 6  weeks - Local wound care  - Keep the wound dry - Weightbearing status and postop shoe - Pain control with as needed oxycodone  as needed IV morphine  for the next 24 to 48 hours Plan to switch to oral antibiotics at discharge.  Skin rash Question drug reaction Patient reports red papular as well as lesions on her hands.  She had had them for 3 to 4 days prior to presentation.  Unclear etiology.  Could be related to oral clindamycin  or terbinafine  that she was recently started as an outpatient.  However unclear to prove.  Liver enzymes normal.  Both suspected medicines are currently on hold. 5/16, was given a dose of IV Solu-Medrol  and famotidine . No need of continuation.  Morbid Obesity  Body mass index is 41.24 kg/m. Patient has been advised to make an attempt to improve diet and exercise patterns to aid in weight loss.  PCOS Not on any treatment currently.    Mobility: Encourage ambulation  Goals of care   Code Status: Full Code     DVT prophylaxis:  enoxaparin  (LOVENOX ) injection 40 mg Start: 04/15/24 2200   Antimicrobials: IV Zosyn , weekly oral fluconazole  Fluid: None Consultants: Podiatry Family Communication: None at bedside  Status: Observation Level of care:  Med-Surg   Patient is from: Home Needs to continue in-hospital care: Continues to need antimicrobial IV Anticipated d/c to: Hopefully home in 1 to 2 days    Diet:  Diet Order             Diet regular Room service appropriate? Yes; Fluid consistency: Thin  Diet effective now  Scheduled Meds:  enoxaparin  (LOVENOX ) injection  40 mg Subcutaneous Q24H   famotidine   20 mg Oral BID   fluconazole   150 mg Oral Weekly   hydrocerin   Topical BID   miconazole  nitrate   Topical Daily    PRN meds: acetaminophen  **OR** acetaminophen , hydrOXYzine , morphine  injection, ondansetron  **OR** ondansetron  (ZOFRAN ) IV, oxyCODONE    Infusions:   piperacillin -tazobactam (ZOSYN )  IV 3.375 g  (04/17/24 0843)    Antimicrobials: Anti-infectives (From admission, onward)    Start     Dose/Rate Route Frequency Ordered Stop   04/15/24 2000  ciprofloxacin  (CIPRO ) tablet 500 mg  Status:  Discontinued        500 mg Oral 2 times daily 04/15/24 1427 04/15/24 1440   04/15/24 1600  fluconazole  (DIFLUCAN ) tablet 150 mg        150 mg Oral Weekly 04/15/24 1417 05/13/24 1559   04/15/24 1600  piperacillin -tazobactam (ZOSYN ) IVPB 3.375 g        3.375 g 12.5 mL/hr over 240 Minutes Intravenous Every 8 hours 04/15/24 1440     04/15/24 1500  cefTRIAXone (ROCEPHIN) 2 g in sodium chloride  0.9 % 100 mL IVPB  Status:  Discontinued        2 g 200 mL/hr over 30 Minutes Intravenous Every 24 hours 04/15/24 1221 04/15/24 1440   04/15/24 0900  Ampicillin -Sulbactam (UNASYN ) 3 g in sodium chloride  0.9 % 100 mL IVPB  Status:  Discontinued        3 g 200 mL/hr over 30 Minutes Intravenous Every 6 hours 04/15/24 0841 04/15/24 1221   04/15/24 0215  Ampicillin -Sulbactam (UNASYN ) 3 g in sodium chloride  0.9 % 100 mL IVPB        3 g 200 mL/hr over 30 Minutes Intravenous  Once 04/15/24 0214 04/15/24 0320       Objective: Vitals:   04/17/24 0524 04/17/24 1416  BP: 106/68 93/60  Pulse: 60 (!) 57  Resp: 14 16  Temp: 98.1 F (36.7 C) 98.2 F (36.8 C)  SpO2: 98% (!) 87%    Intake/Output Summary (Last 24 hours) at 04/17/2024 1443 Last data filed at 04/17/2024 1300 Gross per 24 hour  Intake 1399.08 ml  Output --  Net 1399.08 ml   Filed Weights   04/15/24 0156 04/15/24 1123  Weight: 99.8 kg 99 kg   Weight change:  Body mass index is 41.24 kg/m.   Physical Exam: General exam: Pleasant, young hemoglobin H African-American female Skin: No rashes, lesions or ulcers. HEENT: Atraumatic, normocephalic, no obvious bleeding Lungs: Clear to auscultation bilaterally,  CVS: S1, S2, no murmur,   GI/Abd: Soft, nontender, distended from obesity, bowel sound present,   CNS: Alert, awake, oriented x 3 Psychiatry:  Mood appropriate Extremities: No pedal edema, no calf tenderness, left foot wound with bandage on.     Data Review: I have personally reviewed the laboratory data and studies available.  F/u labs ordered Unresulted Labs (From admission, onward)    None      Signed, Hoyt Macleod, MD [facility] 04/17/2024

## 2024-04-17 NOTE — Plan of Care (Addendum)
  Problem: Pain Managment: Goal: General experience of comfort will improve and/or be controlled 04/17/2024 2157 by Hazen Liter, RN Outcome: Not Progressing Note: Patient c/o 10/10 pain to l. foot with no relief from pain medication. This RN asked patient if she wanted the ordered morphine  for severe pain, patient declined. RN offered patient to contact provider to provide a non pharmacological method for pain, patient currently has hot pack above affected foot, patient declined RN's offer to page provider on call for any additional pain interventions. Patient encouraged to call out if any acute changes, call light in reach. 04/17/2024 1955 by Hazen Liter, RN Outcome: Not Progressing

## 2024-04-18 ENCOUNTER — Ambulatory Visit: Admitting: Podiatry

## 2024-04-18 DIAGNOSIS — L03116 Cellulitis of left lower limb: Secondary | ICD-10-CM | POA: Diagnosis not present

## 2024-04-18 MED ORDER — MICONAZOLE NITRATE POWD: 1.0000 | Freq: Two times a day (BID) | 0 refills | Status: AC

## 2024-04-18 MED ORDER — FLUCONAZOLE 150 MG PO TABS: 150.0000 mg | ORAL_TABLET | ORAL | 0 refills | Status: AC

## 2024-04-18 MED ORDER — OXYCODONE HCL 5 MG PO TABS: 5.0000 mg | ORAL_TABLET | Freq: Four times a day (QID) | ORAL | 0 refills | Status: AC | PRN

## 2024-04-18 NOTE — Discharge Summary (Signed)
 Physician Discharge Summary  Michele Boyd:[id number] DOB: Apr 25, 1991 DOA: 04/15/2024  PCP: Department, [facility]  Admit date: 04/15/2024 Discharge date: 04/18/2024  Admitted From: Home Discharge disposition: Home  Recommendations at discharge:  Completed the course of antibiotics with 7 more days of oral Augmentin .  Also take weekly Diflucan  for next 6 weeks.  Topical miconazole  for the skin rash Follow-up with podiatry as an outpatient  Brief narrative: Michele Boyd is a 33 y.o. female with PMH significant for morbid obesity, PCOS, fibroids, vaginosis 5/16, patient presented to ED with complaint of left foot pain.  Patient had had progressively worse skin breakdown on her left toes area for about a month. She stated she has ulcers from these recurrent fungal infections.   5/8, she was seen by podiatry as an outpatient and put on clindamycin  and oral terbinafine .  Her symptoms continue to progress.  She has an upcoming appointment with podiatry on Monday 5/19, but was unable to wait for this due to significant worsening of pain.   She also reported having a rash on her neck, abdominal and both arms area bilaterally.    In the ED, patient was afebrile, hemodynamically stable. On exam, patient was noted to have left lateral forefoot edematous and severely tender to palpation.  Significant maceration, superficial ulceration and drainage of the toes and plantar toe sulcus digits 2-5 CBC, CMP unremarkable Left foot x-ray showed moderate generalized edema.  No acute osseous findings.  Mild hallux valgus. Patient was started on IV Unasyn , IV morphine  Admitted to [facility] Podiatry was consulted   Subjective: Patient was seen and examined this morning. Lying down in bed.  Not in distress at the leg.  Complains of headache which is better after pain meds.  Hospital course: Cellulitis of left foot Severe tinea pedis Presented with worsening left foot infection in the  setting of PCOS Did not respond to outpatient course of antibiotic and antifungal Not septic at presentation. Imaging without evidence of bony involvement Seen by podiatry. Wound healing better. Patient was treated with IV Zosyn  and Diflucan .  Clinically improving. As recommended by podiatry, will discharge the patient on oral Augmentin  for 7 days as well as Diflucan  150 mg weekly for 4 to 6 weeks - Continue local wound care  - Continue to keep the wound dry - Continue weightbearing status and postop shoe - Continue pain control with PRN oxycodone    Skin rash Question drug reaction Patient reports red papular as well as lesions on her hands.  She had had them for 3 to 4 days prior to presentation.  Unclear etiology.  Could be related to oral clindamycin  or terbinafine  that she was recently started as an outpatient.  However unclear to prove.  Liver enzymes normal.  Both suspected medicines are currently on hold.  Rashes improving.  Continue miconazole  topical powder twice a day.  Morbid Obesity  Body mass index is 41.24 kg/m. Patient has been advised to make an attempt to improve diet and exercise patterns to aid in weight loss.  PCOS Not on any treatment currently.    Mobility: Encourage ambulation  Goals of care   Code Status: Full Code   Diet:  Diet Order             Diet general           Diet regular Room service appropriate? Yes; Fluid consistency: Thin  Diet effective now  Nutritional status:  Body mass index is 41.24 kg/m.       Wounds:  - Wound / Incision (Open or Dehisced) 04/15/24 Non-pressure wound Foot Anterior;Left (Active)  Date First Assessed/Time First Assessed: 04/15/24 1130   Wound Type: Non-pressure wound  Location: Foot  Location Orientation: Anterior;Left  Present on Admission: Yes    Assessments 04/15/2024  3:15 PM 04/17/2024  7:55 PM  Dressing Type Gauze (Comment);Other (Comment);Impregnated gauze (bismuth) Other  (Comment)  Dressing Changed New Other (Comment)  Dressing Status Clean, Dry, Intact Clean, Dry, Intact  Dressing Change Frequency Daily Daily  Wound Length (cm) 15 cm --  Wound Width (cm) 8 cm --  Wound Depth (cm) 0 cm --  Wound Volume (cm^3) 0 cm^3 --  Wound Surface Area (cm^2) 120 cm^2 --     No associated orders.    Discharge Exam:   Vitals:   04/17/24 1416 04/17/24 1937 04/18/24 0450 04/18/24 1326  BP: 93/60 106/79 98/67 114/63  Pulse: (!) 57 62 (!) 58 65  Resp: 16 16 14 20   Temp: 98.2 F (36.8 C) 98.4 F (36.9 C) 98.4 F (36.9 C) 98.4 F (36.9 C)  TempSrc: Oral Oral Oral Oral  SpO2: (!) 87% 100% 100% 94%  Weight:      Height:        Body mass index is 41.24 kg/m.  General exam: Pleasant, young morbidly obese African-American female Skin: Improving generalized skin rashes, lesions or ulcers. HEENT: Atraumatic, normocephalic, no obvious bleeding Lungs: Clear to auscultation bilaterally,  CVS: S1, S2, no murmur,   GI/Abd: Soft, nontender, distended from obesity, bowel sound present,   CNS: Alert, awake, oriented x 3 Psychiatry: Mood appropriate Extremities: No pedal edema, no calf tenderness, left foot wound with bandage on.   Follow ups:    Follow-up Information     Department, [facility] Follow up.   Contact information: [address] [place] [address] [phone]                 Discharge Instructions:   Discharge Instructions     Call MD for:  difficulty breathing, headache or visual disturbances   Complete by: As directed    Call MD for:  extreme fatigue   Complete by: As directed    Call MD for:  hives   Complete by: As directed    Call MD for:  persistant dizziness or light-headedness   Complete by: As directed    Call MD for:  persistant nausea and vomiting   Complete by: As directed    Call MD for:  severe uncontrolled pain   Complete by: As directed    Call MD for:  temperature >100.4   Complete by: As  directed    Diet general   Complete by: As directed    Discharge instructions   Complete by: As directed    Recommendations at discharge:   Completed the course of antibiotics with 7 more days of oral Augmentin .  Also take weekly Diflucan  for next 6 weeks.  Topical miconazole  for the skin rash  Follow-up with podiatry as an outpatient  PDMP reviewed this encounter.   Opioid taper instructions: It is important to wean off of your opioid medication as soon as possible. If you do not need pain medication after your surgery it is ok to stop day one. Opioids include: Codeine, Hydrocodone (Norco, Vicodin), Oxycodone (Percocet, oxycontin ) and hydromorphone amongst others.  Long term and even short term use of opiods can cause: Increased  pain response Dependence Constipation Depression Respiratory depression And more.  Withdrawal symptoms can include Flu like symptoms Nausea, vomiting And more Techniques to manage these symptoms Hydrate well Eat regular healthy meals Stay active Use relaxation techniques(deep breathing, meditating, yoga) Do Not substitute Alcohol to help with tapering If you have been on opioids for less than two weeks and do not have pain than it is ok to stop all together.  Plan to wean off of opioids This plan should start within one week post op of your joint replacement. Maintain the same interval or time between taking each dose and first decrease the dose.  Cut the total daily intake of opioids by one tablet each day Next start to increase the time between doses. The last dose that should be eliminated is the evening dose.        General discharge instructions: Follow with Primary MD Department, [facility] in 7 days  Please request your PCP  to go over your hospital tests, procedures, radiology results at the follow up. Please get your medicines reviewed and adjusted.  Your PCP may decide to repeat certain labs or tests as needed. Do not  drive, operate heavy machinery, perform activities at heights, swimming or participation in water activities or provide baby sitting services if your were admitted for syncope or siezures until you have seen by Primary MD or a Neurologist and advised to do so again. [place]  Controlled Substance Reporting System database was reviewed. Do not drive, operate heavy machinery, perform activities at heights, swim, participate in water activities or provide baby-sitting services while on medications for pain, sleep and mood until your outpatient physician has reevaluated you and advised to do so again.  You are strongly recommended to comply with the dose, frequency and duration of prescribed medications. Activity: As tolerated with Full fall precautions use walker/cane & assistance as needed Avoid using any recreational substances like cigarette, tobacco, alcohol, or non-prescribed drug. If you experience worsening of your admission symptoms, develop shortness of breath, life threatening emergency, suicidal or homicidal thoughts you must seek medical attention immediately by calling 911 or calling your MD immediately  if symptoms less severe. You must read complete instructions/literature along with all the possible adverse reactions/side effects for all the medicines you take and that have been prescribed to you. Take any new medicine only after you have completely understood and accepted all the possible adverse reactions/side effects.  Wear Seat belts while driving. You were cared for by a hospitalist during your hospital stay. If you have any questions about your discharge medications or the care you received while you were in the hospital after you are discharged, you can call the unit and ask to speak with the hospitalist or the covering physician. Once you are discharged, your primary care physician will handle any further medical issues. Please note that NO REFILLS for any discharge medications will  be authorized once you are discharged, as it is imperative that you return to your primary care physician (or establish a relationship with a primary care physician if you do not have one).   Discharge wound care:   Complete by: As directed    Increase activity slowly   Complete by: As directed        Discharge Medications:   Allergies as of 04/18/2024       Reactions   Iodine Other (See Comments)   Was told by a nurse at a [facility] center years ago she is allergic to  this.   Other Other (See Comments)   Must have ChloraPrep applied on the skin before inserting IV(s).   Shellfish Allergy Rash, Other (See Comments)   Was told by mother years ago she "broke out all over- inside and out."        Medication List     STOP taking these medications    clindamycin  300 MG capsule Commonly known as: Cleocin    terbinafine  250 MG tablet Commonly known as: LAMISIL        TAKE these medications    BC HEADACHE POWDER PO Take 1 packet by mouth 2 (two) times daily as needed (for headaches).   fluconazole  150 MG tablet Commonly known as: DIFLUCAN  Take 1 tablet (150 mg total) by mouth once a week for 6 doses. Start taking on: Apr 22, 2024   ketoconazole  2 % cream Commonly known as: NIZORAL  Apply 1 Application topically daily.   miconazole  nitrate Powd Commonly known as: MICATIN Apply 1 Application topically 2 (two) times daily for 13 days.   oxyCODONE  5 MG immediate release tablet Commonly known as: Oxy IR/ROXICODONE  Take 1 tablet (5 mg total) by mouth every 6 (six) hours as needed for up to 5 days for moderate pain (pain score 4-6).               Discharge Care Instructions  (From admission, onward)           Start     Ordered   04/18/24 0000  Discharge wound care:        04/18/24 1350             The results of significant diagnostics from this hospitalization (including imaging, microbiology, ancillary and laboratory) are listed below for reference.     Procedures and Diagnostic Studies:   DG Foot Complete Left Result Date: 04/15/2024 CLINICAL DATA:  Left foot pain x1 month with recurrent fungal infections. EXAM: LEFT FOOT - COMPLETE 3+ VIEW COMPARISON:  None Available. FINDINGS: Three views. There is no evidence of fracture or dislocation. There is no evidence of arthropathy or other focal bone abnormality. There is mild hallux valgus with otherwise normal osseous alignment. Moderate generalized edema. IMPRESSION: 1. Moderate generalized edema. No acute osseous findings. 2. Mild hallux valgus. Electronically Signed   By: Denman Fischer M.D.   On: 04/15/2024 03:08     Labs:   Basic Metabolic Panel: Recent Labs  Lab 04/15/24 0245 04/15/24 1241 04/16/24 0733  NA 139  --  135  K 3.7  --  4.0  CL 105  --  104  CO2 22  --  21*  GLUCOSE 92  --  127*  BUN 10  --  11  CREATININE 0.83  --  0.89  CALCIUM 9.6  --  9.1  MG  --  2.1  --   PHOS  --  3.0  --    GFR Estimated Creatinine Clearance: 96 mL/min (by C-G formula based on SCr of 0.89 mg/dL). Liver Function Tests: Recent Labs  Lab 04/15/24 1241 04/16/24 0733  AST 21 19  ALT 20 20  ALKPHOS 55 60  BILITOT 0.6 0.6  PROT 7.2 7.0  ALBUMIN 4.0 3.7   No results for input(s): "LIPASE", "AMYLASE" in the last 168 hours. No results for input(s): "AMMONIA" in the last 168 hours. Coagulation profile No results for input(s): "INR", "PROTIME" in the last 168 hours.  CBC: Recent Labs  Lab 04/15/24 0245 04/16/24 0733  WBC 4.3 8.6  NEUTROABS 2.0  --  HGB 13.7 13.7  HCT 40.4 42.6  MCV 93.1 96.6  PLT 300 317   Cardiac Enzymes: No results for input(s): "CKTOTAL", "CKMB", "CKMBINDEX", "TROPONINI" in the last 168 hours. BNP: Invalid input(s): "POCBNP" CBG: No results for input(s): "GLUCAP" in the last 168 hours. D-Dimer No results for input(s): "DDIMER" in the last 72 hours. Hgb A1c Recent Labs    04/15/24 1527  HGBA1C 5.0   Lipid Profile No results for input(s):  "CHOL", "HDL", "LDLCALC", "TRIG", "CHOLHDL", "LDLDIRECT" in the last 72 hours. Thyroid  function studies No results for input(s): "TSH", "T4TOTAL", "T3FREE", "THYROIDAB" in the last 72 hours.  Invalid input(s): "FREET3" Anemia work up No results for input(s): "VITAMINB12", "FOLATE", "FERRITIN", "TIBC", "IRON", "RETICCTPCT" in the last 72 hours. Microbiology Recent Results (from the past 240 hours)  MRSA Next Gen by PCR, Nasal     Status: None   Collection Time: 04/15/24  1:19 PM   Specimen: Nasal Mucosa; Nasal Swab  Result Value Ref Range Status   MRSA by PCR Next Gen NOT DETECTED NOT DETECTED Final    Comment: (NOTE) The GeneXpert MRSA Assay (FDA approved for NASAL specimens only), is one component of a comprehensive MRSA colonization surveillance program. It is not intended to diagnose MRSA infection nor to guide or monitor treatment for MRSA infections. Test performance is not FDA approved in patients less than 68 years old. Performed at [facility], [address] [place], [place] [address]     Time coordinating discharge: 45 minutes  Signed: Avannah Decker  [facility] 04/18/2024, 1:50 PM

## 2024-04-18 NOTE — Progress Notes (Signed)
 Discharge instructions gone over with Armin [place]ing. She was getting upset saying I'm suppose to take care of this myself and understand what these instructions aree

## 2024-04-21 ENCOUNTER — Ambulatory Visit: Admitting: Podiatry

## 2024-04-21 ENCOUNTER — Ambulatory Visit (INDEPENDENT_AMBULATORY_CARE_PROVIDER_SITE_OTHER)

## 2024-04-21 ENCOUNTER — Encounter: Payer: Self-pay | Admitting: Podiatry

## 2024-04-21 VITALS — Ht 61.0 in | Wt 218.0 lb

## 2024-04-21 DIAGNOSIS — L03119 Cellulitis of unspecified part of limb: Secondary | ICD-10-CM | POA: Diagnosis not present

## 2024-04-21 DIAGNOSIS — L02619 Cutaneous abscess of unspecified foot: Secondary | ICD-10-CM | POA: Diagnosis not present

## 2024-04-21 DIAGNOSIS — L03116 Cellulitis of left lower limb: Secondary | ICD-10-CM | POA: Diagnosis not present

## 2024-04-21 MED ORDER — DOXYCYCLINE HYCLATE 100 MG PO TABS: 100.0000 mg | ORAL_TABLET | Freq: Two times a day (BID) | ORAL | 1 refills | Status: AC

## 2024-04-22 NOTE — Progress Notes (Signed)
 Subjective:   Patient ID: Michele Boyd, female   DOB: 33 y.o.   MRN: [id number]   HPI Patient states she was in the hospital several days has seen Dr. Marinus Sic there is relatively stable but feels like she needs an antibiotic   ROS      Objective:  Physical Exam  Neurovascular status intact with patient found to have significant forefoot sloughing left but does appear to have a good layer of tissue underneath this with history of fungal bacterial infection that has been treated with patient not currently on antibiotic.  Continues to take an antifungal agent currently and there is no approximately erythema drainage noted her temperature is normal     Assessment:  Patient has had what appears to be some form of cellulitic process with the possibility of fungal infection or other soft tissue pathology but it appears stable currently     Plan:  H&P reviewed we will start her on some soaks to try to debride the excess tissue and I carefully cleaned some of this out and applied sterile dressing today.  No active drainage noted as precautionary measure since there still is some tissue exposure I did place her on antibiotic doxycycline  again I gave strict instructions if any negative changes were to occur to go straight to the emergency room and she is to take her temperature daily.  Patient will see our physicians in the next 2 weeks or earlier if needed  X-rays were negative for signs of osteolysis or bone infection

## 2024-04-29 ENCOUNTER — Ambulatory Visit: Admitting: Podiatry

## 2024-07-11 ENCOUNTER — Ambulatory Visit

## 2024-09-06 ENCOUNTER — Other Ambulatory Visit: Payer: Self-pay

## 2024-09-06 DIAGNOSIS — S199XXA Unspecified injury of neck, initial encounter: Secondary | ICD-10-CM | POA: Diagnosis present

## 2024-09-06 DIAGNOSIS — S39012A Strain of muscle, fascia and tendon of lower back, initial encounter: Secondary | ICD-10-CM | POA: Diagnosis not present

## 2024-09-06 DIAGNOSIS — Y92410 Unspecified street and highway as the place of occurrence of the external cause: Secondary | ICD-10-CM | POA: Diagnosis not present

## 2024-09-06 DIAGNOSIS — S161XXA Strain of muscle, fascia and tendon at neck level, initial encounter: Secondary | ICD-10-CM | POA: Diagnosis not present
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
## 2024-09-07 ENCOUNTER — Emergency Department (HOSPITAL_BASED_OUTPATIENT_CLINIC_OR_DEPARTMENT_OTHER)
Admission: EM | Admit: 2024-09-07 | Discharge: 2024-09-07 | Disposition: A | Discharge status: Home / Self Care | Attending: Emergency Medicine | Admitting: Emergency Medicine

## 2024-09-07 DIAGNOSIS — S161XXA Strain of muscle, fascia and tendon at neck level, initial encounter: Secondary | ICD-10-CM

## 2024-09-07 DIAGNOSIS — S39012A Strain of muscle, fascia and tendon of lower back, initial encounter: Secondary | ICD-10-CM

## 2024-09-07 MED ORDER — KETOROLAC TROMETHAMINE 10 MG PO TABS: 10.0000 mg | ORAL_TABLET | Freq: Four times a day (QID) | ORAL | 0 refills | Status: AC | PRN

## 2024-09-07 MED ORDER — LIDOCAINE 5 % EX PTCH: 1.0000 | MEDICATED_PATCH | CUTANEOUS | 0 refills | Status: AC

## 2024-09-07 MED ORDER — LIDOCAINE 5 % EX PTCH
1.0000 | MEDICATED_PATCH | CUTANEOUS | Status: DC
  Administered 2024-09-07: 1 via TRANSDERMAL
  Filled 2024-09-07: qty 1

## 2024-09-07 MED ORDER — KETOROLAC TROMETHAMINE 60 MG/2ML IM SOLN
30.0000 mg | Freq: Once | INTRAMUSCULAR | Status: AC
  Administered 2024-09-07: 30 mg via INTRAMUSCULAR
  Filled 2024-09-07: qty 2

## 2024-09-07 MED ORDER — CYCLOBENZAPRINE HCL 5 MG PO TABS: 5.0000 mg | ORAL_TABLET | Freq: Once | ORAL | Status: DC

## 2024-09-07 MED ORDER — CYCLOBENZAPRINE HCL 10 MG PO TABS: 10.0000 mg | ORAL_TABLET | Freq: Two times a day (BID) | ORAL | 0 refills | Status: AC | PRN
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
## 2024-09-14 ENCOUNTER — Ambulatory Visit
